# Patient Record
Sex: Female | Born: 1945 | Race: White | Hispanic: No | Smoking: Never smoker
Health system: Southern US, Community
[De-identification: ages and names within clinical notes are randomized; demographics above are authoritative.]

## PROBLEM LIST (undated history)

## (undated) DIAGNOSIS — F0781 Postconcussional syndrome: Secondary | ICD-10-CM

## (undated) DIAGNOSIS — F419 Anxiety disorder, unspecified: Secondary | ICD-10-CM

## (undated) DIAGNOSIS — S161XXA Strain of muscle, fascia and tendon at neck level, initial encounter: Secondary | ICD-10-CM

## (undated) DIAGNOSIS — J45909 Unspecified asthma, uncomplicated: Secondary | ICD-10-CM

## (undated) DIAGNOSIS — R430 Anosmia: Secondary | ICD-10-CM

## (undated) HISTORY — PX: CHOLECYSTECTOMY: SHX55

## (undated) HISTORY — PX: KNEE ARTHROSCOPY: SUR90

---
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
## 1998-03-22 ENCOUNTER — Other Ambulatory Visit: Admission: RE | Admit: 1998-03-22 | Discharge: 1998-03-22 | Payer: Self-pay | Admitting: Obstetrics and Gynecology

## 2000-02-14 ENCOUNTER — Encounter: Admission: RE | Admit: 2000-02-14 | Discharge: 2000-02-14 | Payer: Self-pay | Admitting: Obstetrics and Gynecology

## 2000-02-14 ENCOUNTER — Encounter: Payer: Self-pay | Admitting: Obstetrics and Gynecology

## 2001-05-15 ENCOUNTER — Encounter: Payer: Self-pay | Admitting: Obstetrics and Gynecology

## 2001-05-15 ENCOUNTER — Encounter: Admission: RE | Admit: 2001-05-15 | Discharge: 2001-05-15 | Payer: Self-pay | Admitting: Obstetrics and Gynecology

## 2001-09-05 ENCOUNTER — Ambulatory Visit (HOSPITAL_COMMUNITY): Admission: RE | Admit: 2001-09-05 | Discharge: 2001-09-05 | Payer: Self-pay | Admitting: Obstetrics and Gynecology

## 2001-09-05 ENCOUNTER — Encounter: Payer: Self-pay | Admitting: Obstetrics and Gynecology

## 2002-05-20 ENCOUNTER — Encounter: Payer: Self-pay | Admitting: Obstetrics and Gynecology

## 2002-05-20 ENCOUNTER — Encounter: Admission: RE | Admit: 2002-05-20 | Discharge: 2002-05-20 | Payer: Self-pay | Admitting: Obstetrics and Gynecology

## 2003-02-15 ENCOUNTER — Encounter: Payer: Self-pay | Admitting: Internal Medicine

## 2003-02-15 ENCOUNTER — Ambulatory Visit (HOSPITAL_COMMUNITY): Admission: RE | Admit: 2003-02-15 | Discharge: 2003-02-15 | Payer: Self-pay | Admitting: Internal Medicine

## 2003-04-02 ENCOUNTER — Ambulatory Visit (HOSPITAL_COMMUNITY): Admission: RE | Admit: 2003-04-02 | Discharge: 2003-04-02 | Payer: Self-pay | Admitting: Internal Medicine

## 2003-05-28 ENCOUNTER — Encounter: Admission: RE | Admit: 2003-05-28 | Discharge: 2003-05-28 | Payer: Self-pay | Admitting: Obstetrics and Gynecology

## 2003-05-28 ENCOUNTER — Encounter: Payer: Self-pay | Admitting: Obstetrics and Gynecology

## 2004-05-31 ENCOUNTER — Encounter: Admission: RE | Admit: 2004-05-31 | Discharge: 2004-05-31 | Payer: Self-pay | Admitting: Obstetrics and Gynecology

## 2005-07-26 ENCOUNTER — Ambulatory Visit (HOSPITAL_COMMUNITY): Admission: RE | Admit: 2005-07-26 | Discharge: 2005-07-26 | Payer: Self-pay | Admitting: Obstetrics and Gynecology

## 2006-08-30 ENCOUNTER — Ambulatory Visit (HOSPITAL_COMMUNITY): Admission: RE | Admit: 2006-08-30 | Discharge: 2006-08-30 | Payer: Self-pay | Admitting: Obstetrics and Gynecology

## 2007-09-04 ENCOUNTER — Ambulatory Visit (HOSPITAL_COMMUNITY): Admission: RE | Admit: 2007-09-04 | Discharge: 2007-09-04 | Payer: Self-pay | Admitting: Obstetrics and Gynecology

## 2008-09-07 ENCOUNTER — Encounter: Admission: RE | Admit: 2008-09-07 | Discharge: 2008-09-07 | Payer: Self-pay | Admitting: Obstetrics and Gynecology

## 2009-09-20 ENCOUNTER — Encounter: Admission: RE | Admit: 2009-09-20 | Discharge: 2009-09-20 | Payer: Self-pay | Admitting: Obstetrics and Gynecology

## 2009-09-29 ENCOUNTER — Encounter: Admission: RE | Admit: 2009-09-29 | Discharge: 2009-09-29 | Payer: Self-pay | Admitting: Obstetrics and Gynecology

## 2010-10-05 ENCOUNTER — Encounter
Admission: RE | Admit: 2010-10-05 | Discharge: 2010-10-05 | Payer: Self-pay | Source: Home / Self Care | Attending: Obstetrics and Gynecology | Admitting: Obstetrics and Gynecology

## 2010-10-22 ENCOUNTER — Encounter: Payer: Self-pay | Admitting: Obstetrics and Gynecology

## 2011-02-16 NOTE — Op Note (Signed)
   NAME:  Alison House, Alison House                          ACCOUNT NO.:  [id number]   MEDICAL RECORD NO.:  [id number]                   PATIENT TYPE:  AMB   LOCATION:  DAY                                  FACILITY:  [facility]   PHYSICIAN:  Lionel December, M.D.                 DATE OF BIRTH:  05-18-1946   DATE OF PROCEDURE:  04/02/2003  DATE OF DISCHARGE:                                 OPERATIVE REPORT   PROCEDURE:  Total colonoscopy.   INDICATIONS:  Karinda is a 65 year old Caucasian female who is undergoing  screening colonoscopy.  She is average risk for CRC.  Procedure was reviewed  with the patient and informed consent was obtained.   PREOPERATIVE MEDICATIONS:  Demerol 50 mg IV, Versed 7 mg IV in divided  doses.   FINDINGS:  The procedure was performed in the endoscopy suite.  The  patient's vital signs and O2 saturation were monitored during the procedure  and remained stable.  The patient was placed in the left lateral recumbent  position.  Rectal examination was performed.  No abnormality noted on  external or digital exam.  Olympus videoscope was placed in the rectum and  advanced under vision into the sigmoid colon and beyond.  Preparation was  satisfactory.  She had some liquid stool scattered here and there.  The  scope was passed to the cecum which was identified by the ileocecal valve  and appendiceal orifice.  Pictures taken for right record and part of a data  base.  As the scope was withdrawn, colonic mucosa was carefully examined.  There was a 5 mm  polyp at the proximal transverse colon which was snared  for histologic examination.  Mucosa and the rest of the colon was normal.  Rectal mucosa similarly was normal.  Scope was retroflexed to examine the  anorectal junction and small hemorrhoids were noted below the dentate line.  There was also a small papilla.  Endoscope was then withdrawn.  The patient  tolerated the procedure well.   FINAL DIAGNOSIS:  1. A 5 mm polyp snared  from the transverse colon.  2. Small external hemorrhoids.   RECOMMENDATIONS:  Standard instructions given.  I will be contacting the  patient with biopsy results.  If this is an adenoma, she will return for  repeat examination in five years; otherwise could wait 10 years before next  exam.                                               Lionel December, M.D.   NR/MEDQ  D:  04/02/2003  T:  04/02/2003  Job:  [id number]   cc:   Kingsley Callander. Ouida Sills, M.D.  [address]  [place]  [place] [address]  Fax: [phone]

## 2011-10-15 ENCOUNTER — Other Ambulatory Visit (HOSPITAL_COMMUNITY): Payer: Self-pay | Admitting: Internal Medicine

## 2011-10-15 DIAGNOSIS — Z139 Encounter for screening, unspecified: Secondary | ICD-10-CM

## 2011-10-19 ENCOUNTER — Ambulatory Visit (HOSPITAL_COMMUNITY): Payer: Self-pay

## 2011-10-26 ENCOUNTER — Ambulatory Visit (HOSPITAL_COMMUNITY): Payer: Self-pay

## 2011-11-01 ENCOUNTER — Ambulatory Visit (HOSPITAL_COMMUNITY)
Admission: RE | Admit: 2011-11-01 | Discharge: 2011-11-01 | Disposition: A | Discharge status: Home / Self Care | Payer: Medicare Other | Source: Ambulatory Visit | Attending: Internal Medicine | Admitting: Internal Medicine

## 2011-11-01 DIAGNOSIS — Z1231 Encounter for screening mammogram for malignant neoplasm of breast: Secondary | ICD-10-CM | POA: Insufficient documentation

## 2011-11-01 DIAGNOSIS — Z139 Encounter for screening, unspecified: Secondary | ICD-10-CM

## 2012-11-17 ENCOUNTER — Other Ambulatory Visit: Payer: Self-pay | Admitting: Internal Medicine

## 2012-11-17 DIAGNOSIS — Z1231 Encounter for screening mammogram for malignant neoplasm of breast: Secondary | ICD-10-CM

## 2012-12-15 ENCOUNTER — Ambulatory Visit: Payer: Medicare Other

## 2013-01-08 ENCOUNTER — Ambulatory Visit
Admission: RE | Admit: 2013-01-08 | Discharge: 2013-01-08 | Disposition: A | Discharge status: Home / Self Care | Payer: Medicare Other | Source: Ambulatory Visit | Attending: Internal Medicine | Admitting: Internal Medicine

## 2013-01-08 DIAGNOSIS — Z1231 Encounter for screening mammogram for malignant neoplasm of breast: Secondary | ICD-10-CM

## 2013-03-25 ENCOUNTER — Other Ambulatory Visit (HOSPITAL_COMMUNITY): Payer: Self-pay | Admitting: Internal Medicine

## 2013-03-25 ENCOUNTER — Ambulatory Visit (HOSPITAL_COMMUNITY)
Admission: RE | Admit: 2013-03-25 | Discharge: 2013-03-25 | Disposition: A | Discharge status: Home / Self Care | Payer: Medicare Other | Source: Ambulatory Visit | Attending: Internal Medicine | Admitting: Internal Medicine

## 2013-03-25 DIAGNOSIS — W19XXXA Unspecified fall, initial encounter: Secondary | ICD-10-CM | POA: Insufficient documentation

## 2013-03-25 DIAGNOSIS — S99919A Unspecified injury of unspecified ankle, initial encounter: Secondary | ICD-10-CM | POA: Insufficient documentation

## 2013-03-25 DIAGNOSIS — M25569 Pain in unspecified knee: Secondary | ICD-10-CM | POA: Insufficient documentation

## 2013-03-25 DIAGNOSIS — M25562 Pain in left knee: Secondary | ICD-10-CM

## 2013-03-25 DIAGNOSIS — S8990XA Unspecified injury of unspecified lower leg, initial encounter: Secondary | ICD-10-CM | POA: Insufficient documentation

## 2013-03-25 DIAGNOSIS — M25469 Effusion, unspecified knee: Secondary | ICD-10-CM | POA: Insufficient documentation

## 2013-04-23 ENCOUNTER — Other Ambulatory Visit (HOSPITAL_COMMUNITY): Payer: Self-pay | Admitting: Internal Medicine

## 2013-04-23 DIAGNOSIS — M25562 Pain in left knee: Secondary | ICD-10-CM

## 2013-04-27 ENCOUNTER — Encounter (HOSPITAL_COMMUNITY): Payer: Self-pay

## 2013-04-27 ENCOUNTER — Ambulatory Visit (HOSPITAL_COMMUNITY)
Admission: RE | Admit: 2013-04-27 | Discharge: 2013-04-27 | Disposition: A | Discharge status: Home / Self Care | Payer: Medicare Other | Source: Ambulatory Visit | Attending: Internal Medicine | Admitting: Internal Medicine

## 2013-04-27 DIAGNOSIS — M7120 Synovial cyst of popliteal space [Baker], unspecified knee: Secondary | ICD-10-CM | POA: Insufficient documentation

## 2013-04-27 DIAGNOSIS — IMO0002 Reserved for concepts with insufficient information to code with codable children: Secondary | ICD-10-CM | POA: Insufficient documentation

## 2013-04-27 DIAGNOSIS — Z9181 History of falling: Secondary | ICD-10-CM | POA: Insufficient documentation

## 2013-04-27 DIAGNOSIS — M25562 Pain in left knee: Secondary | ICD-10-CM

## 2013-04-27 DIAGNOSIS — S83289A Other tear of lateral meniscus, current injury, unspecified knee, initial encounter: Secondary | ICD-10-CM | POA: Insufficient documentation

## 2013-04-27 DIAGNOSIS — W19XXXA Unspecified fall, initial encounter: Secondary | ICD-10-CM | POA: Insufficient documentation

## 2013-04-27 DIAGNOSIS — M25569 Pain in unspecified knee: Secondary | ICD-10-CM | POA: Insufficient documentation

## 2013-07-28 ENCOUNTER — Ambulatory Visit (HOSPITAL_COMMUNITY)
Admission: RE | Admit: 2013-07-28 | Discharge: 2013-07-28 | Disposition: A | Discharge status: Home / Self Care | Payer: Medicare Other | Source: Ambulatory Visit | Attending: Orthopedic Surgery | Admitting: Orthopedic Surgery

## 2013-07-28 DIAGNOSIS — M25669 Stiffness of unspecified knee, not elsewhere classified: Secondary | ICD-10-CM | POA: Insufficient documentation

## 2013-07-28 DIAGNOSIS — M25569 Pain in unspecified knee: Secondary | ICD-10-CM | POA: Insufficient documentation

## 2013-07-28 DIAGNOSIS — M25462 Effusion, left knee: Secondary | ICD-10-CM | POA: Insufficient documentation

## 2013-07-28 DIAGNOSIS — M25562 Pain in left knee: Secondary | ICD-10-CM

## 2013-07-28 DIAGNOSIS — M25469 Effusion, unspecified knee: Secondary | ICD-10-CM | POA: Insufficient documentation

## 2013-07-28 DIAGNOSIS — IMO0001 Reserved for inherently not codable concepts without codable children: Secondary | ICD-10-CM | POA: Diagnosis present

## 2013-07-28 NOTE — Evaluation (Signed)
Physical Therapy Evaluation  Patient Details  Name: JESSEL GETTINGER MRN: [id number] Date of Birth: 1946/09/04  Today's Date: 07/28/2013 Time:1300 [id number] PT Time Calculation (min): 45 mincharge evaluation              Visit#: 11 of 8 8 Re-eval: 08/27/13 Assessment Diagnosis: Lt knee arthroscopy Surgical Date: 07/10/13 Next MD Visit: 08/2013 Prior Therapy: none  Authorization: BCBS Medicare BCBS Medicare    Authorization Visit#: 11 of 8 8  Past Medical History: No past medical history on file. Past Surgical History: No past surgical history on file.  Subjective Symptoms/Limitations Symptoms: Pt states that she had her surgery on 07/11/2103.  She is doing much better since the surgery.  She is icing three times a day.  She states that she still feels very tight in the knee.  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  She has noticed a little weakness in her Lt leg but no pain.  She is being referred to therapy to return her to full functional ability. How long can you sit comfortably?: Pt able to sit for ten minutes and then she wants to move her knee How long can you stand comfortably?: Able to stand for 30 minutes but no pain How long can you walk comfortably?: Able to walk for 15 minutes but this is not limited by pain task is just simply done. Pain Assessment Currently in Pain?: Yes Pain Score: 0-No pain Pain Location: Knee Pain Orientation: Left     Prior Functioning  Prior Function Level of Independence: Independent with basic ADLs Vocation: Full time employment Vocation Requirements: sitting, standing Leisure: Hobbies-no  Cognition/Observation Cognition Overall Cognitive Status: Within Functional Limits for tasks assessed  Sensation/Coordination/Flexibility/Functional Tests  foto 47  Assessment LLE AROM (degrees) Left Knee Extension: 15 Left Knee Flexion: 102 LLE Strength Left Hip Flexion: 5/5 Left Hip  Extension: 5/5 Left Hip ABduction: 5/5 Left Knee Flexion: 5/5 Left Knee Extension: 5/5 Left Ankle Dorsiflexion: 5/5  Exercise/Treatments Mobility/Balance  Static Standing Balance Single Leg Stance - Right Leg: 30 Single Leg Stance - Left Leg: 25     Supine Quad Sets: 10 reps Heel Slides: 10 reps Terminal Knee Extension: 10 reps   Manual Therapy Manual Therapy: Other (comment) Other Manual Therapy: decongestve techniques to decrease swelling  Physical Therapy Assessment and Plan PT Assessment and Plan Clinical Impression Statement: Pt s/p Lt knee arthroscopic surgery for partial menisectomy who has decreased ROM, edema, and decreased balance.  Ms. Boster will benefti from skilled PT to improve her deficits and maximize her functional ability. Pt will benefit from skilled therapeutic intervention in order to improve on the following deficits: Difficulty walking;Abnormal gait;Increased edema;Decreased range of motion;Decreased balance;Decreased strength Rehab Potential: Good PT Frequency: Min 2X/week PT Duration: 4 weeks PT Treatment/Interventions: Therapeutic activities;Therapeutic exercise;Manual techniques PT Plan: begin bike, rockerboard, standing terminal ext, standing knee flexion, slant board.  Progress to step ups and functional squatting.    Goals Home Exercise Program Pt/caregiver will Perform Home Exercise Program: For increased strengthening;For increased ROM PT Short Term Goals Time to Complete Short Term Goals: 2 weeks PT Short Term Goal 1: Pt ROM to be 5-110 to allow more normalized gait pattern PT Short Term Goal 2: Pt to be able to sit for an hour to enjoy a meal at a restaurant PT Long Term Goals Time to Complete Long Term Goals: 4 weeks PT Long Term Goal 1: Pt  to be completing an advanced HEP PT Long Term Goal 2: Pt ROM to be 0 -125 to allow squatting activites Long Term Goal 3: Pt to RTW with no difficulty  Long Term Goal 4: Strength wnl to be able to go  up and down steps in a reciprocal manner  Problem List Patient Active Problem List   Diagnosis Date Noted  . Stiffness of joint, not elsewhere classified, lower leg 07/28/2013  . Pain in knee 07/28/2013  . Swelling of joint of left knee 07/28/2013       GP Functional Limitation: Changing and maintaining body position Changing and Maintaining Body Position Current Status (N8295): At least 40 percent but less than 60 percent impaired, limited or restricted Changing and Maintaining Body Position Goal Status (A2130): At least 20 percent but less than 40 percent impaired, limited or restricted  Madalene Mickler,CINDY 07/28/2013, 4:51 PM  Physician Documentation Your signature is required to indicate approval of the treatment plan as stated above.  Please sign and either send electronically or make a copy of this report for your files and return this physician signed original.   Please mark one 1.__approve of plan  2. ___approve of plan with the following conditions.   ______________________________                                                          _____________________ Physician Signature                                                                                                             Date

## 2013-07-30 ENCOUNTER — Ambulatory Visit (HOSPITAL_COMMUNITY)
Admission: RE | Admit: 2013-07-30 | Discharge: 2013-07-30 | Disposition: A | Discharge status: Home / Self Care | Payer: Medicare Other | Source: Ambulatory Visit | Attending: Internal Medicine | Admitting: Internal Medicine

## 2013-07-30 DIAGNOSIS — IMO0001 Reserved for inherently not codable concepts without codable children: Secondary | ICD-10-CM | POA: Diagnosis not present

## 2013-07-30 NOTE — Progress Notes (Signed)
Physical Therapy Treatment Patient Details  Name: Alison House MRN: [id number] Date of Birth: 03/21/1946  Today's Date: 07/30/2013 Time: 1302-1350 PT Time Calculation (min): 48 min  Visit#: 2 of 8  Re-eval: 08/27/13 Authorization: BCBS Medicare  Authorization Visit#: 2 of 8  Charges:  therex [id number] (33'), massage 1337-1347 (10)  Subjective: Symptoms/Limitations Symptoms: Pt reports compliance with HEP.  Currently without pain only stiffness.   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  Pt able to demonstrate established therex correctly.  Overall progressing well with stiffness/swelling more than pain.  Pt instruced with scar massage due to noted adhesions at surgical sites.  Improved ROM with decrease swelling following retro massage. PT Plan: continue to progress strength and function.   Progress to step ups and functional squatting.     Problem List Patient Active Problem List   Diagnosis Date Noted  . Stiffness of joint, not elsewhere classified, lower leg 07/28/2013  . Pain in knee 07/28/2013  . Swelling of joint of left knee 07/28/2013       GP Functional Limitation: Changing and maintaining body position  Lurena Nida, PTA/CLT 07/30/2013, 3:09 PM

## 2013-08-04 ENCOUNTER — Ambulatory Visit (HOSPITAL_COMMUNITY)
Admission: RE | Admit: 2013-08-04 | Discharge: 2013-08-04 | Disposition: A | Discharge status: Home / Self Care | Payer: Medicare Other | Source: Ambulatory Visit | Attending: Orthopedic Surgery | Admitting: Orthopedic Surgery

## 2013-08-04 DIAGNOSIS — IMO0001 Reserved for inherently not codable concepts without codable children: Secondary | ICD-10-CM | POA: Insufficient documentation

## 2013-08-04 DIAGNOSIS — M25469 Effusion, unspecified knee: Secondary | ICD-10-CM | POA: Insufficient documentation

## 2013-08-04 DIAGNOSIS — M25669 Stiffness of unspecified knee, not elsewhere classified: Secondary | ICD-10-CM | POA: Insufficient documentation

## 2013-08-04 DIAGNOSIS — M25569 Pain in unspecified knee: Secondary | ICD-10-CM | POA: Insufficient documentation

## 2013-08-04 NOTE — Progress Notes (Signed)
Physical Therapy Treatment Patient Details  Name: Alison House MRN: [id number] Date of Birth: 02/12/1946  Today's Date: 08/04/2013 Time: 4098-1191 PT Time Calculation (min): 46 min Visit#: 3 of 8  Re-eval: 08/27/13 Authorization: BCBS Medicare  Authorization Visit#: 3 of 8  Charges:  therex (34') 1432-1506, manual (10') [id number]  Subjective: Symptoms/Limitations Symptoms: Pt states she returned to work today.  States she tried to keep it moving while she was driving so it wouldnt be so stiff when she got out of her car. Pain Assessment Currently in Pain?: No/denies   Exercise/Treatments Stretches Gastroc Stretch: 3 reps;30 seconds;Limitations Gastroc Stretch Limitations: slant board Aerobic Stationary Bike: 8' seat 8 Standing Heel Raises: 10 reps;Limitations Heel Raises Limitations: toeraises 10 reps Knee Flexion: 10 reps Lateral Step Up: 10 reps;Step Height: 4";Left;Hand Hold: 1 Functional Squat: 10 reps Rocker Board: 2 minutes SLS: 3 trials 34" max Supine Quad Sets: 15 reps Heel Slides: 10 reps   Manual Therapy Manual Therapy: Massage Massage: Retro massage to Lt knee with elevation to decrease swelling; massage to scars from surgery   Physical Therapy Assessment and Plan PT Assessment and Plan Clinical Impression Statement: Added lateral step ups and squats today.  pt with difficulty performing squats in correct form.  Improving strength with overall reduction in swelling and adhesions around scar in Rt knee.  Pt reported pain reduction at end of session following manual techniques. PT Plan: continue to progress strength and function.   Add forward step ups/downs and lunges next visit.     Problem List Patient Active Problem List   Diagnosis Date Noted  . Stiffness of joint, not elsewhere classified, lower leg 07/28/2013  . Pain in knee 07/28/2013  . Swelling of joint of left knee 07/28/2013       Lurena Nida, PTA/CLT 08/04/2013, 3:56 PM

## 2013-08-06 ENCOUNTER — Ambulatory Visit (HOSPITAL_COMMUNITY)
Admission: RE | Admit: 2013-08-06 | Discharge: 2013-08-06 | Disposition: A | Discharge status: Home / Self Care | Payer: Medicare Other | Source: Ambulatory Visit | Attending: Internal Medicine | Admitting: Internal Medicine

## 2013-08-06 DIAGNOSIS — M25462 Effusion, left knee: Secondary | ICD-10-CM

## 2013-08-06 DIAGNOSIS — M25562 Pain in left knee: Secondary | ICD-10-CM

## 2013-08-06 DIAGNOSIS — M25669 Stiffness of unspecified knee, not elsewhere classified: Secondary | ICD-10-CM

## 2013-08-06 NOTE — Progress Notes (Signed)
Physical Therapy Treatment Patient Details  Name: Alison House MRN: [id number] Date of Birth: [birth year]  Today's Date: 08/06/2013 Time: 7829-5621 PT Time Calculation (min): 53 min Charge:  There ex 1425-1506; IP [id number] Visit#: 4 of 8  Re-eval: 08/27/13    Authorization: BCBS Medicare    Subjective: Symptoms/Limitations Symptoms: Pt states that since she has returned to work she has had more swelling and pain.  Only able to ice twice a day due to work. Pain Assessment Currently in Pain?: Yes Pain Score: 6  Pain Orientation: Left   Exercise/Treatments       Stretches Gastroc Stretch: 3 reps;30 seconds;Limitations Gastroc Stretch Limitations: slant board Aerobic Stationary Bike: 8' seat 8   Standing Heel Raises: 10 reps;Limitations Knee Flexion: 10 reps Terminal Knee Extension: 10 reps Lateral Step Up: 10 reps;Step Height: 4";Left;Hand Hold: 1 Forward Step Up: 10 reps Functional Squat: 10 reps Rocker Board: 2 minutes SLS with Vectors: 3 x10 sec    Supine Quad Sets: 15 reps Heel Slides: 10 reps Terminal Knee Extension: 10 reps  Modalities Modalities: Cryotherapy Cryotherapy Number Minutes Cryotherapy: 10 Minutes Cryotherapy Location: Knee Type of Cryotherapy: Ice pack  Physical Therapy Assessment and Plan PT Assessment and Plan Clinical Impression Statement: Pt completed vector stances, terminal extension and slant board stretch.  Pt improving in balance and ROM.   PT Plan: Add lunges assess if ice or massage helps with pain/swelling the most     Problem List Patient Active Problem List   Diagnosis Date Noted  . Stiffness of joint, not elsewhere classified, lower leg 07/28/2013  . Pain in knee 07/28/2013  . Swelling of joint of left knee 07/28/2013       GP    Emanual Lamountain,CINDY 08/06/2013, 4:51 PM

## 2013-08-11 ENCOUNTER — Ambulatory Visit (HOSPITAL_COMMUNITY)
Admission: RE | Admit: 2013-08-11 | Discharge: 2013-08-11 | Disposition: A | Discharge status: Home / Self Care | Payer: Medicare Other | Source: Ambulatory Visit | Attending: Internal Medicine | Admitting: Internal Medicine

## 2013-08-11 DIAGNOSIS — M25562 Pain in left knee: Secondary | ICD-10-CM

## 2013-08-11 DIAGNOSIS — M25462 Effusion, left knee: Secondary | ICD-10-CM

## 2013-08-11 DIAGNOSIS — M25669 Stiffness of unspecified knee, not elsewhere classified: Secondary | ICD-10-CM

## 2013-08-11 NOTE — Evaluation (Signed)
Physical Therapy Evaluation  Patient Details  Name: Alison House MRN: [id number] Date of Birth: [birth year]  Today's Date: 08/11/2013 Time: 0936-1023 PT Time Calculation (min): 47 min Charge there es [phone]; gt [phone]; manual 1005-1023             Visit#: 5 of 8  Re-eval: 08/27/13 Assessment Diagnosis: Lt knee arthroscopy Surgical Date: 07/10/13 Next MD Visit: 08/2013 Prior Therapy: none  Authorization: BCBS Medicare    Authorization Time Period:    Authorization Visit#: 4 of 8   Past Medical History: No past medical history on file. Past Surgical History: No past surgical history on file.  Subjective Symptoms/Limitations Symptoms: Pt states that she continues to have swelling but she is in and out of her car all day with her job.  Pt feels the retro massage does better to control her swelling compared to the ice How long can you sit comfortably?: Able to sit for 15 minutes with comfort was 10. How long can you stand comfortably?: Able to stand for 30 minutes and then she is done with her task. How long can you walk comfortably?: Longest she has walked for at one time is an hour was 15 minutes. Pain Assessment Currently in Pain?: No/denies (more discomfort than pain was 6/10)  Prior Functioning  Prior Function Level of Independence: Independent with basic ADLs Vocation: Full time employment Vocation Requirements: sitting, standing Leisure: Hobbies-no  Cognition/Observation Cognition Overall Cognitive Status: Within Functional Limits for tasks assessed  Assessment LLE AROM (degrees) Left Knee Extension: 13 Left Knee Flexion: 110 (102) LLE PROM (degrees) Left Knee Extension: 115 Left Knee Flexion: 8 LLE Strength Left Hip Flexion: 5/5 Left Hip Extension: 5/5 Left Hip ABduction: 5/5 Left Knee Flexion: 5/5 Left Knee Extension: 5/5 Left Ankle Dorsiflexion: 5/5  Exercise/Treatments Mobility/Balance  Static Standing Balance Single Leg Stance - Right Leg:  30 Single Leg Stance - Left Leg: 25   Stretches Active Hamstring Stretch: 3 reps;30 seconds Passive Hamstring Stretch: 2 reps;60 seconds   Standing Terminal Knee Extension: 15 reps Stairs: 1 Rt-stairwelll Gait Training: proper heel toe gt Supine Quad Sets: 15 reps Heel Slides: 15 reps Terminal Knee Extension: 15 reps Knee Extension: PROM Knee Flexion: PROM    Manual Therapy Manual Therapy: Massage Massage: retro massage  Physical Therapy Assessment and Plan PT Assessment and Plan Clinical Impression Statement: Pt is not completing quad sets at home emphasised the importance of this.  Pt continued to have an antalgic gait after working on proper heel toe gait pt gait patter appeared normal  PT Plan: Pt has three more visits concentrate on gait, ROM and swelling with manual techniques    Goals Home Exercise Program Pt/caregiver will Perform Home Exercise Program: For increased strengthening;For increased ROM PT Goal: Perform Home Exercise Program - Progress: Progressing toward goal PT Short Term Goals PT Short Term Goal 1: Pt ROM to be 5-110 to allow more normalized gait pattern PT Short Term Goal 1 - Progress: Progressing toward goal PT Short Term Goal 2: Pt to be able to sit for an hour to enjoy a meal at a restaurant PT Short Term Goal 2 - Progress: Progressing toward goal PT Long Term Goals Time to Complete Long Term Goals: 4 weeks PT Long Term Goal 1: Pt to be completing an advanced HEP PT Long Term Goal 2: Pt ROM to be 0 -125 to allow squatting activites PT Long Term Goal 2 - Progress: Not met Long Term Goal 3: Pt to RTW with no  difficulty  Long Term Goal 3 Progress: Progressing toward goal Long Term Goal 4: Strength wnl to be able to go up and down steps in a reciprocal manner Long Term Goal 4 Progress: Met  Problem List Patient Active Problem List   Diagnosis Date Noted  . Stiffness of joint, not elsewhere classified, lower leg 07/28/2013  . Pain in knee  07/28/2013  . Swelling of joint of left knee 07/28/2013       GP    [name],CINDY 08/11/2013, 10:42 AM  Physician Documentation Your signature is required to indicate approval of the treatment plan as stated above.  Please sign and either send electronically or make a copy of this report for your files and return this physician signed original.   Please mark one 1.__approve of plan  2. ___approve of plan with the following conditions.   ______________________________                                                          _____________________ Physician Signature                                                                                                             Date

## 2013-08-13 ENCOUNTER — Ambulatory Visit (HOSPITAL_COMMUNITY)
Admission: RE | Admit: 2013-08-13 | Discharge: 2013-08-13 | Disposition: A | Discharge status: Home / Self Care | Payer: Medicare Other | Source: Ambulatory Visit | Attending: Internal Medicine | Admitting: Internal Medicine

## 2013-08-13 NOTE — Progress Notes (Signed)
Physical Therapy Treatment Patient Details  Name: Alison House MRN: [id number] Date of Birth: 23-Aug-1946  Today's Date: 08/13/2013 Time: 6578-4696 PT Time Calculation (min): 43 min Charges: Therex x 28'(1432-1500) Manual x 12'(1503-1515)  Visit#: 6 of 8  Re-eval: 08/27/13  Authorization: BCBS Medicare  Authorization Visit#: 6 of 8   Subjective: Symptoms/Limitations Symptoms: Pt states that she is not having any pain currently. Pain Assessment Currently in Pain?: No/denies  Exercise/Treatments Stretches Passive Hamstring Stretch: 3 reps;30 seconds;Limitations Passive Hamstring Stretch Limitations: with rope Supine Quad Sets: 10 reps;Limitations Quad Sets Limitations: 5" holds Short Arc Quad Sets: 10 reps;Limitations Short Arc Quad Sets Limitations: 5" holds Terminal Knee Extension: 10 reps;Limitations Terminal Knee Extension Limitations: 5" holds Knee Extension: PROM Knee Flexion: PROM   Modalities Modalities: Cryotherapy Manual Therapy Manual Therapy: Edema management Edema Management: Retrograde massage to decrease swelling in LLE.  Physical Therapy Assessment and Plan PT Assessment and Plan Clinical Impression Statement: Treatment focus on increasing motion/distal quad strength and decreasing swelling. Pt requires multimodal cueing to improve distal quad contraction. Retrograde massage completed to decrease swelling. Pt displays improved gait mechanics this session. Pt plans to ice knee at home. PT Plan: Concentrate on gait, ROM and swelling with manual techniques    Problem List Patient Active Problem List   Diagnosis Date Noted  . Stiffness of joint, not elsewhere classified, lower leg 07/28/2013  . Pain in knee 07/28/2013  . Swelling of joint of left knee 07/28/2013    PT - End of Session Activity Tolerance: Patient tolerated treatment well General Behavior During Therapy: [facility] for tasks assessed/performed  Seth Bake, PTA  08/13/2013, 3:21  PM

## 2013-08-17 ENCOUNTER — Ambulatory Visit (HOSPITAL_COMMUNITY)
Admission: RE | Admit: 2013-08-17 | Discharge: 2013-08-17 | Disposition: A | Discharge status: Home / Self Care | Payer: Medicare Other | Source: Ambulatory Visit | Attending: *Deleted | Admitting: *Deleted

## 2013-08-17 NOTE — Progress Notes (Signed)
Physical Therapy Treatment Patient Details  Name: Alison House MRN: [id number] Date of Birth: 02-Feb-1946  Today's Date: 08/17/2013 Time: 5621-3086 PT Time Calculation (min): 45 min Charges: Therex x 32'(1640-1712) Ice x 10'(1715-1725)  Visit#: 7 of 8  Re-eval: 08/27/13  Authorization: BCBS Medicare  Authorization Visit#: 7 of 8   Subjective: Symptoms/Limitations Symptoms: Pt states that she is currently not having any pain, just stiffness. Pain Assessment Currently in Pain?: No/denies  Exercise/Treatments Stretches Active Hamstring Stretch: 3 reps;30 seconds;Limitations Active Hamstring Stretch Limitations: Seated at edge of mat longsitting Quad Stretch: 2 reps;30 seconds;Limitations Quad Stretch Limitations: Chair lunge Gastroc Stretch: 3 reps;30 seconds;Limitations Gastroc Stretch Limitations: slant board Supine Quad Sets: 10 reps;Limitations Quad Sets Limitations: 5" holds Short Arc [employer] Sets: 10 reps;Limitations Short Arc Quad Sets Limitations: 5" holds Terminal Knee Extension: 10 reps;Limitations Terminal Knee Extension Limitations: 5" holds Knee Extension: PROM Knee Flexion: PROM Other Supine Knee Exercises: Knee to chest stretch to improve knee flexion   Modalities Modalities: Cryotherapy Cryotherapy Number Minutes Cryotherapy: 10 Minutes Cryotherapy Location: Knee Type of Cryotherapy: Ice pack  Physical Therapy Assessment and Plan PT Assessment and Plan Clinical Impression Statement: Pt continues to require multimodal cueing to facilitate distal quad contraction. Significant decrease in swelling noted in LLE. Retrograde massage unnecessary this session. Ice applied t end of session to limit pain and inflammation. PT Plan: Concentrate on gait, ROM and swelling with manual techniques    Goals    Problem List Patient Active Problem List   Diagnosis Date Noted  . Stiffness of joint, not elsewhere classified, lower leg 07/28/2013  . Pain in knee  07/28/2013  . Swelling of joint of left knee 07/28/2013    PT - End of Session Activity Tolerance: Patient tolerated treatment well General Behavior During Therapy: [facility] for tasks assessed/performed PT Plan of Care PT Home Exercise Plan: Given. See scanned documents.  Seth Bake, PTA  08/17/2013, 5:33 PM

## 2013-08-18 ENCOUNTER — Ambulatory Visit (HOSPITAL_COMMUNITY): Payer: Medicare Other | Admitting: Physical Therapy

## 2013-08-20 ENCOUNTER — Ambulatory Visit (HOSPITAL_COMMUNITY)
Admission: RE | Admit: 2013-08-20 | Discharge: 2013-08-20 | Disposition: A | Discharge status: Home / Self Care | Payer: Medicare Other | Source: Ambulatory Visit | Attending: Internal Medicine | Admitting: Internal Medicine

## 2013-08-20 DIAGNOSIS — M25669 Stiffness of unspecified knee, not elsewhere classified: Secondary | ICD-10-CM

## 2013-08-20 DIAGNOSIS — M25462 Effusion, left knee: Secondary | ICD-10-CM

## 2013-08-20 DIAGNOSIS — M25562 Pain in left knee: Secondary | ICD-10-CM

## 2013-08-20 NOTE — Evaluation (Signed)
Physical Therapy Re-Evaluation  Patient Details  Name: Alison House MRN: [id number] Date of Birth: 05-29-1946  Today's Date: 08/20/2013 Time: 1430-1511 PT Time Calculation (min): 41 min Charges:  TE: 1430-1440 ROM: 1440-1441 Manual: 1441-1500 Self Care: 1500-1511             Visit#: 8 of 12  Re-eval: 09/03/13 Assessment Diagnosis: Lt knee arthroscopy Surgical Date: 07/10/13 Next MD Visit: Dr. Thurston Hole - 09/09/13 Prior Therapy: none  Authorization: BCBS Medicare    Authorization Time Period:    Authorization Visit#: 8 of 18   Subjective Symptoms/Limitations Symptoms: Pt reports she has returned to work about 2 weeks.  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  She feels when the swellling goes away she will be doing much better.  Pain Assessment Currently in Pain?: No/denies  Sensation/Coordination/Flexibility/Functional Tests Functional Tests Functional Tests: FOTO: 60/40  Assessment LLE AROM (degrees) Left Knee Extension: 6 (was 13) Left Knee Flexion: 110 (was 110) LLE PROM (degrees) Left Knee Extension: 3 (was 8) Left Knee Flexion: 115 (was 115)  Exercise/Treatments Bike: Seat 8 x10 minutes for AROM  Manual Therapy Manual Therapy: Myofascial release Myofascial Release: to distal quadricep, patella mobs and quadriceps muscle to decrease fascial restrictions.  Used the stick for education and had pt use it on herself for pt education.   Physical Therapy Assessment and Plan PT Assessment and Plan Clinical Impression Statement: Alison House has attended 8 OP PT visits s/p Lt knee scope with following findings: strength is WNL, AROM is improving 6-113 degrees, continues to be limited by increased swelling and "stiffness" to her knee with significant muscle spasms and fascial restrictions.  Educated pt today on self massagers and self massaging techniques and compression stockings to help control his stiffness and swelling. At  this time feel pt will continue to benefit x4 visits to address remaining AROM difficulty and "stiffness" with return to work (RTW) activites.  PT Plan: Manual techniques to decrease fascial restrictions and improve AROM.     Goals Home Exercise Program Pt/caregiver will Perform Home Exercise Program: For increased strengthening;For increased ROM PT Goal: Perform Home Exercise Program - Progress: Met PT Short Term Goals PT Short Term Goal 1: Pt ROM to be 5-110 to allow more normalized gait pattern PT Short Term Goal 2: Pt to be able to sit for an hour to enjoy a meal at a restaurant (no difficulty sitting) PT Short Term Goal 2 - Progress: Met PT Long Term Goals Time to Complete Long Term Goals: 4 weeks PT Long Term Goal 1: Pt to be completing an advanced HEP PT Long Term Goal 1 - Progress: Met PT Long Term Goal 2: Pt ROM to be 0 -125 to allow squatting activites Long Term Goal 3: Pt to RTW with no difficulty  Long Term Goal 3 Progress: Partly met (stiffness getting into and out of her car) Long Term Goal 4: Strength wnl to be able to go up and down steps in a reciprocal manner Long Term Goal 4 Progress: Met  Problem List Patient Active Problem List   Diagnosis Date Noted  . Stiffness of joint, not elsewhere classified, lower leg 07/28/2013  . Pain in knee 07/28/2013  . Swelling of joint of left knee 07/28/2013    PT - End of Session Activity Tolerance: Patient tolerated treatment well General Behavior During Therapy: [facility] for tasks assessed/performed PT Plan of Care PT Home Exercise Plan: Given. See scanned documents.  PT Patient Instructions: Educated on self massagers "the stick" and compression garments.  Consulted and Agree with Plan of Care: Patient  GP Functional Assessment Tool Used: FOTO: 60/40 Functional Limitation: Changing and maintaining body position Changing and Maintaining Body Position Current Status (Z6109): At least 40 percent but less than 60 percent  impaired, limited or restricted Changing and Maintaining Body Position Goal Status (U0454): At least 20 percent but less than 40 percent impaired, limited or restricted  Sandip Power, MPT, ATC 08/20/2013, 3:20 PM  Physician Documentation Your signature is required to indicate approval of the treatment plan as stated above.  Please sign and either send electronically or make a copy of this report for your files and return this physician signed original.   Please mark one 1.__approve of plan  2. ___approve of plan with the following conditions.   ______________________________                                                          _____________________ Physician Signature                                                                                                             Date

## 2013-09-01 ENCOUNTER — Ambulatory Visit (HOSPITAL_COMMUNITY)
Admission: RE | Admit: 2013-09-01 | Discharge: 2013-09-01 | Disposition: A | Discharge status: Home / Self Care | Payer: Medicare Other | Source: Ambulatory Visit | Attending: Orthopedic Surgery | Admitting: Orthopedic Surgery

## 2013-09-01 DIAGNOSIS — M25569 Pain in unspecified knee: Secondary | ICD-10-CM | POA: Insufficient documentation

## 2013-09-01 DIAGNOSIS — IMO0001 Reserved for inherently not codable concepts without codable children: Secondary | ICD-10-CM | POA: Insufficient documentation

## 2013-09-01 DIAGNOSIS — M25462 Effusion, left knee: Secondary | ICD-10-CM

## 2013-09-01 DIAGNOSIS — M25469 Effusion, unspecified knee: Secondary | ICD-10-CM | POA: Insufficient documentation

## 2013-09-01 DIAGNOSIS — M25562 Pain in left knee: Secondary | ICD-10-CM

## 2013-09-01 DIAGNOSIS — M25669 Stiffness of unspecified knee, not elsewhere classified: Secondary | ICD-10-CM | POA: Insufficient documentation

## 2013-09-01 NOTE — Progress Notes (Signed)
Physical Therapy Treatment Patient Details  Name: Alison House MRN: [id number] Date of Birth: [birth year]  Today's Date: 09/01/2013 Time: 4696-2952 PT Time Calculation (min): 46 min Charges: Manual: 840-855 TE: 853-916 Ice: 1 Visit#: 9 of 12  Re-eval: 09/03/13    Authorization: BCBS Medicare  Authorization Time Period:    Authorization Visit#: 9 of 18   Subjective: Symptoms/Limitations Symptoms: She is using her stick when she is driving long distances.  Pt states most difficulty with stiffness when getting out of the car. She is wearing compression stockings.  Pain Assessment Currently in Pain?: No/denies  Precautions/Restrictions     Exercise/Treatments Stretches Hip Flexor Stretch: 3 reps;30 seconds Gastroc Stretch: 3 reps;30 seconds Gastroc Stretch Limitations: slant board   Standing Terminal Knee Extension: Left;15 reps;10 reps (w/PT facilaiton) Other Standing Knee Exercises: Heel walking 2 RT Other Standing Knee Exercises: Postural education on TKE x10 reps 5 sec holds Supine Quad Sets: 10 reps;Limitations Quad Sets Limitations: 5" holds Short Arc [employer] Sets: 10 reps;Limitations Short Arc Quad Sets Limitations: 5" holds, 4# (w/PT facilitation) Prone  Other Prone Exercises: TKE 10x5 sec holds (PT Facilation)   Modalities Modalities: Cryotherapy Manual Therapy Manual Therapy: Joint mobilization Joint Mobilization: Grade III to Lt fibular head to improve mobility.  Myofascial Release: Supine and Prone to Lt knee: fibular head, popliteal region distal quadricep, patella mobs and quadriceps muscle to decrease fascial restrictions Cryotherapy Number Minutes Cryotherapy: 10 Minutes Cryotherapy Location: Knee Type of Cryotherapy: Ice pack  Physical Therapy Assessment and Plan PT Assessment and Plan Clinical Impression Statement: Had signfiicant popliteal trigger point to Lt knee which resolved by 75% after manual techniques.  Educated pt on HEP she can  complete at work in a standing position and updated with pictures.  Continues to require max VC for proper knee extension activation with decreased coordinated movement noted to distal Lt quadricep. Pt will f/u with MD today.  PT Plan: Manual techniques to decrease fascial restrictions and improve AROM with stretching and functional AROM activities.  Add seated LAQ for TKE.     Goals    Problem List Patient Active Problem List   Diagnosis Date Noted  . Stiffness of joint, not elsewhere classified, lower leg 07/28/2013  . Pain in knee 07/28/2013  . Swelling of joint of left knee 07/28/2013    PT - End of Session Activity Tolerance: Patient tolerated treatment well General Behavior During Therapy: WFL for tasks assessed/performed PT Plan of Care PT Home Exercise Plan: Updated PT Patient Instructions: HEP for work [employer] and Agree with Plan of Care: Patient  GP Functional Assessment Tool Used: FOTO: 60/40  Leomia Blake, MPT, ATC 09/01/2013, 9:34 AM

## 2013-09-04 ENCOUNTER — Ambulatory Visit (HOSPITAL_COMMUNITY)
Admission: RE | Admit: 2013-09-04 | Discharge: 2013-09-04 | Disposition: A | Discharge status: Home / Self Care | Payer: Medicare Other | Source: Ambulatory Visit | Attending: Internal Medicine | Admitting: Internal Medicine

## 2013-09-04 NOTE — Progress Notes (Signed)
Physical Therapy Treatment Patient Details  Name: Alison House MRN: [id number] Date of Birth: 1945-10-25  Today's Date: 09/04/2013 Time: 1425-1520 PT Time Calculation (min): 55 min  Visit#: 10 of 12  Re-eval: 09/03/13 Authorization: BCBS Medicare  Authorization Visit#: 10 of 18  Charges:  therex 15', manual 25', ice 10'  Subjective: Symptoms/Limitations Symptoms: Pt states she is doing so well.  States she continues to do her HEP and is using her stick to help decrease adhesions.  Pt reports MD has released her from care.  Pt requests to finish her last 2 visits then will be ready for discharge to HEP. Pain Assessment Currently in Pain?: No/denies   Exercise/Treatments Standing Terminal Knee Extension: Left;15 reps Other Standing Knee Exercises: Heel walking 2 RT Prone  Other Prone Exercises: TKE 15x5 sec holds   Modalities Modalities: Cryotherapy Manual Therapy Manual Therapy: Myofascial release Myofascial Release: Supine and Prone to Lt knee: fibular head, popliteal region distal quadricep, patella mobs and quadriceps muscle to decrease fascial restrictions Cryotherapy Number Minutes Cryotherapy: 10 Minutes Cryotherapy Location: Knee Type of Cryotherapy: Ice pack  Physical Therapy Assessment and Plan PT Assessment and Plan PT Assessment:  Continued to focus on manual techniques to decrease adhesions.  Most tightness medial Lt knee.  Encouraged to continue HEP, rolling and icing as needed. PT Plan: Focus on Manual techniques to decrease fascial restrictions and improve AROM with stretching and functional AROM activities.  Continue X visits then discharge.      Problem List Patient Active Problem List   Diagnosis Date Noted  . Stiffness of joint, not elsewhere classified, lower leg 07/28/2013  . Pain in knee 07/28/2013  . Swelling of joint of left knee 07/28/2013    PT Plan of Care PT Home Exercise Plan: Updated PT Patient Instructions: HEP for  work [employer] and Agree with Plan of Care: Patient  GP Functional Assessment Tool Used: FOTO: 60/40  Lurena Nida, PTA/CLT 09/04/2013, 3:57 PM

## 2013-09-08 ENCOUNTER — Ambulatory Visit (HOSPITAL_COMMUNITY)
Admission: RE | Admit: 2013-09-08 | Discharge: 2013-09-08 | Disposition: A | Discharge status: Home / Self Care | Payer: Medicare Other | Source: Ambulatory Visit | Attending: Internal Medicine | Admitting: Internal Medicine

## 2013-09-08 DIAGNOSIS — M25462 Effusion, left knee: Secondary | ICD-10-CM

## 2013-09-08 DIAGNOSIS — M25669 Stiffness of unspecified knee, not elsewhere classified: Secondary | ICD-10-CM

## 2013-09-08 DIAGNOSIS — M25562 Pain in left knee: Secondary | ICD-10-CM

## 2013-09-08 NOTE — Progress Notes (Signed)
Physical Therapy Treatment Patient Details  Name: Alison House MRN: [id number] Date of Birth: 1946/03/14  Today's Date: 09/08/2013 Time: 1420-1520 PT Time Calculation (min): 60 min Charge:Therex 1420-1430, Manual [id number], Ice [id number]  Visit#: 11 of 12  Re-eval: 09/03/13 Assessment Diagnosis: Lt knee arthroscopy Surgical Date: 07/10/13 Next MD Visit: Dr. Thurston Hole - 09/09/13? Prior Therapy: none  Authorization: BCBS Medicare  Authorization Time Period:    Authorization Visit#: 11 of 18   Subjective: Symptoms/Limitations Symptoms: Pt reports she is doing good, no pain just tightness. Pain Assessment Currently in Pain?: No/denies  Objective:   Exercise/Treatments Stretches ITB Stretch: 3 reps;30 seconds Standing Other Standing Knee Exercises: Heel walking 2 RT Supine Quad Sets: 10 reps;Limitations Quad Sets Limitations: 10" holds Prone  Other Prone Exercises: TKE 15x5 sec holds   Manual Therapy Manual Therapy: Myofascial release Myofascial Release: Supine and Prone to Lt knee: fibular head, popliteal region distal quadricep, ITBand patella mobs and quadriceps and gastorc muscle to decrease fascial restrictions Cryotherapy Number Minutes Cryotherapy: 10 Minutes Cryotherapy Location: Knee Type of Cryotherapy: Ice pack  Physical Therapy Assessment and Plan PT Assessment and Plan Clinical Impression Statement: Continued session focus on manual techniques to reduce tightness and adhesionx to popliteal, quad, IT band and gastorc region.  Instructed ITBand stretch to improve flexibility.  Pt with max therapist facilitation to improve coordination of quadricep contraction, encouraged pt to increase sets HEP.  PT Plan: Re-eval next session.  Focus on Manual techniques to decrease fascial restrictions and improve AROM with stretching and functional AROM activities.      Goals Home Exercise Program Pt/caregiver will Perform Home Exercise Program: For increased  strengthening;For increased ROM PT Short Term Goals PT Short Term Goal 1: Pt ROM to be 5-110 to allow more normalized gait pattern PT Short Term Goal 1 - Progress: Progressing toward goal PT Short Term Goal 2: Pt to be able to sit for an hour to enjoy a meal at a restaurant (no difficulty sitting) PT Long Term Goals Time to Complete Long Term Goals: 4 weeks PT Long Term Goal 1: Pt to be completing an advanced HEP PT Long Term Goal 2: Pt ROM to be 0 -125 to allow squatting activites Long Term Goal 3: Pt to RTW with no difficulty  Long Term Goal 3 Progress: Met (RTW 08/03/2013) Long Term Goal 4: Strength wnl to be able to go up and down steps in a reciprocal manner  Problem List Patient Active Problem List   Diagnosis Date Noted  . Stiffness of joint, not elsewhere classified, lower leg 07/28/2013  . Pain in knee 07/28/2013  . Swelling of joint of left knee 07/28/2013    PT - End of Session Activity Tolerance: Patient tolerated treatment well General Behavior During Therapy: [facility] for tasks assessed/performed  GP    Juel Burrow 09/08/2013, 3:34 PM

## 2013-09-10 ENCOUNTER — Ambulatory Visit (HOSPITAL_COMMUNITY)
Admission: RE | Admit: 2013-09-10 | Discharge: 2013-09-10 | Disposition: A | Discharge status: Home / Self Care | Payer: Medicare Other | Source: Ambulatory Visit | Attending: Internal Medicine | Admitting: Internal Medicine

## 2013-09-10 DIAGNOSIS — M25462 Effusion, left knee: Secondary | ICD-10-CM

## 2013-09-10 DIAGNOSIS — M25562 Pain in left knee: Secondary | ICD-10-CM

## 2013-09-10 DIAGNOSIS — M25669 Stiffness of unspecified knee, not elsewhere classified: Secondary | ICD-10-CM

## 2013-09-10 NOTE — Evaluation (Signed)
Physical Therapy Treatment/Discharge  Patient Details  Name: Alison House MRN: [id number] Date of Birth: 01/01/1946  Today's Date: 09/10/2013 Time: 0865-7846 PT Time Calculation (min): 53 min Charges: 1 ROM Self Care: 9629-5284 Manual: 1528-1600 Ice: 1              Visit#: 12 of 12  Re-eval: 09/03/13 Assessment Diagnosis: Lt knee arthroscopy Surgical Date: 07/10/13 Next MD Visit: Dr. Thurston Hole - unscheduled  Authorization: BCBS Medicare    Authorization Time Period:    Authorization Visit#: 12 of 18   Subjective Symptoms/Limitations Symptoms: Repores only stiff in the morning.  is independent using her massage stick.  she feels it will take time. has returned to work.  is ready for d/c Pain Assessment Currently in Pain?: No/denies    Sensation/Coordination/Flexibility/Functional Tests Functional Tests Functional Tests: FOTO: 78/22  Assessment LLE PROM (degrees) Left Knee Extension: 0 (was 0) Left Knee Flexion: 115 (was 115)  Exercise/Treatments  Modalities Modalities: Cryotherapy Manual Therapy Manual Therapy: Myofascial release Myofascial Release: Supine and Prone to Lt knee: fibular head, popliteal region distal quadricep, ITBand patella mobs and quadriceps and gastorc muscle to decrease fascial restrictions Cryotherapy Number Minutes Cryotherapy: 10 Minutes Cryotherapy Location: Knee Type of Cryotherapy: Ice pack  Physical Therapy Assessment and Plan PT Assessment and Plan Clinical Impression Statement: Alison House has attended 12 OP PT visits over the past 6 weeks with the following findings: she has improved her functional AROM and her strength is 5/5 thorughout.  Has been educated and has purchased self massage tools which has helped to decrease her stiffness and pain. At this time pt is ready for D/C from PT.  PT Plan: D/C    Goals Home Exercise Program Pt/caregiver will Perform Home Exercise Program: For increased strengthening;For increased ROM PT  Goal: Perform Home Exercise Program - Progress: Met PT Short Term Goals PT Short Term Goal 1: Pt ROM to be 5-110 to allow more normalized gait pattern PT Short Term Goal 1 - Progress: Met PT Short Term Goal 2: Pt to be able to sit for an hour to enjoy a meal at a restaurant (no difficulty sitting) PT Short Term Goal 2 - Progress: Met PT Long Term Goals Time to Complete Long Term Goals: 4 weeks PT Long Term Goal 1: Pt to be completing an advanced HEP PT Long Term Goal 1 - Progress: Met PT Long Term Goal 2: Pt ROM to be 0 -125 to allow squatting activites PT Long Term Goal 2 - Progress: Met Long Term Goal 3: Pt to RTW with no difficulty  Long Term Goal 3 Progress: Met Long Term Goal 4: Strength wnl to be able to go up and down steps in a reciprocal manner Long Term Goal 4 Progress: Met  Problem List Patient Active Problem List   Diagnosis Date Noted  . Stiffness of joint, not elsewhere classified, lower leg 07/28/2013  . Pain in knee 07/28/2013  . Swelling of joint of left knee 07/28/2013    PT - End of Session Activity Tolerance: Patient tolerated treatment well General Behavior During Therapy: [facility] for tasks assessed/performed PT Plan of Care PT Patient Instructions: discussed FOTO and maintance massage Consulted and Agree with Plan of Care: Patient  GP Functional Assessment Tool Used: FOTO: 78/22 Functional Limitation: Changing and maintaining body position Changing and Maintaining Body Position Goal Status (X3244): At least 20 percent but less than 40 percent impaired, limited or restricted Changing and Maintaining Body Position Discharge Status [phone]): At least 20 percent  but less than 40 percent impaired, limited or restricted  Rayah Fines, MPT, ATC 09/10/2013, 4:16 PM  Physician Documentation Your signature is required to indicate approval of the treatment plan as stated above.  Please sign and either send electronically or make a copy of this report for your files and  return this physician signed original.   Please mark one 1.__approve of plan  2. ___approve of plan with the following conditions.   ______________________________                                                          _____________________ Physician Signature                                                                                                             Date

## 2013-09-11 ENCOUNTER — Ambulatory Visit (HOSPITAL_COMMUNITY): Payer: Medicare Other

## 2013-09-15 ENCOUNTER — Ambulatory Visit (HOSPITAL_COMMUNITY): Payer: Medicare Other | Admitting: Physical Therapy

## 2013-09-18 ENCOUNTER — Ambulatory Visit (HOSPITAL_COMMUNITY): Payer: Medicare Other | Admitting: Physical Therapy

## 2013-11-18 ENCOUNTER — Other Ambulatory Visit (HOSPITAL_COMMUNITY): Payer: Self-pay | Admitting: Internal Medicine

## 2013-11-18 DIAGNOSIS — Z139 Encounter for screening, unspecified: Secondary | ICD-10-CM

## 2014-01-15 ENCOUNTER — Ambulatory Visit (HOSPITAL_COMMUNITY)
Admission: RE | Admit: 2014-01-15 | Discharge: 2014-01-15 | Disposition: A | Discharge status: Home / Self Care | Payer: Medicare HMO | Source: Ambulatory Visit | Attending: Internal Medicine | Admitting: Internal Medicine

## 2014-01-15 DIAGNOSIS — Z1231 Encounter for screening mammogram for malignant neoplasm of breast: Secondary | ICD-10-CM | POA: Diagnosis not present

## 2014-01-15 DIAGNOSIS — Z139 Encounter for screening, unspecified: Secondary | ICD-10-CM

## 2014-04-30 ENCOUNTER — Encounter (HOSPITAL_COMMUNITY): Payer: Self-pay | Admitting: Emergency Medicine

## 2014-04-30 ENCOUNTER — Emergency Department (HOSPITAL_COMMUNITY)
Admission: EM | Admit: 2014-04-30 | Discharge: 2014-04-30 | Disposition: A | Discharge status: Home / Self Care | Payer: Medicare HMO | Attending: Emergency Medicine | Admitting: Emergency Medicine

## 2014-04-30 DIAGNOSIS — R21 Rash and other nonspecific skin eruption: Secondary | ICD-10-CM | POA: Diagnosis present

## 2014-04-30 DIAGNOSIS — L259 Unspecified contact dermatitis, unspecified cause: Secondary | ICD-10-CM | POA: Insufficient documentation

## 2014-04-30 MED ORDER — PREDNISONE 50 MG PO TABS
ORAL_TABLET | ORAL | Status: DC
Start: 2014-04-30 — End: 2016-12-10

## 2014-04-30 MED ORDER — HYDROCORTISONE 1 % EX CREA
TOPICAL_CREAM | CUTANEOUS | Status: DC
Start: 2014-04-30 — End: 2016-12-10

## 2014-04-30 MED ORDER — DIPHENHYDRAMINE HCL 25 MG PO TABS: 25.0000 mg | ORAL_TABLET | Freq: Four times a day (QID) | ORAL | Status: DC

## 2014-04-30 NOTE — ED Notes (Signed)
Rash rash right side of neck and under left arm.   Thinks she may have shingles.

## 2014-04-30 NOTE — Discharge Instructions (Signed)

## 2014-04-30 NOTE — ED Provider Notes (Signed)
CSN: [id number]     Arrival date & time 04/30/14  1538 History   First MD Initiated Contact with Patient 04/30/14 1550    This chart was scribed for No att. providers found by Marica Otter, ED Scribe. This patient was seen in room APA07/APA07 and the patient's care was started at 4:48 PM.  Chief Complaint  Patient presents with  . Rash   PCP: Carylon Perches, MD  The history is provided by the patient. No language interpreter was used.   HPI Comments: Alison House is a 68 y.o. female who presents to the Emergency Department complaining of rash to the right side of her neck spreading to the back of her head onset last night. Pt reports the areas are very itchy, however, denies any burning sensation. Pt denies exposure to any new substance or change in any hygiene products. Pt denies any new meds. Pt reports she has been taking benadryl and applying OTC topical anti itch creams to the affected areas without much relief. Pt denies chest pain, SOB, new tick bites. Pt reports in 1999 she was treated for skin irritation that she believes was shingles.    History reviewed. No pertinent past medical history. Past Surgical History  Procedure Laterality Date  . Knee arthroscopy     History reviewed. No pertinent family history. History  Substance Use Topics  . Smoking status: Never Smoker   . Smokeless tobacco: Not on file  . Alcohol Use: No   OB History   Grav Para Term Preterm Abortions TAB SAB Ect Mult Living                 Review of Systems A complete 10 system review of systems was obtained and all systems are negative except as noted in the HPI and PMH.    Allergies  Sulfur  Home Medications   Prior to Admission medications   Medication Sig Start Date End Date Taking? Authorizing Provider  diphenhydrAMINE (BENADRYL) 25 MG tablet Take 1 tablet (25 mg total) by mouth every 6 (six) hours. 04/30/14   Glynn Octave, MD  hydrocortisone cream 1 % Apply to affected area 2 times daily  04/30/14   Glynn Octave, MD  predniSONE (DELTASONE) 50 MG tablet 1 tablet PO daily 04/30/14   Glynn Octave, MD   Triage Vitals: BP 138/68  Pulse 98  Temp(Src) 98.1 F (36.7 C) (Oral)  Resp 18  Ht 5\' 5"  (1.651 m)  Wt 210 lb (95.255 kg)  BMI 34.95 kg/m2  SpO2 96% Physical Exam  Nursing note and vitals reviewed. Constitutional: She is oriented to person, place, and time. She appears well-developed and well-nourished. No distress.  HENT:  Head: Normocephalic and atraumatic.  Mouth/Throat: Oropharynx is clear and moist. No oropharyngeal exudate.  Eyes: Conjunctivae and EOM are normal. Pupils are equal, round, and reactive to light.  Neck: Normal range of motion. Neck supple.  No meningismus.  Cardiovascular: Normal rate, regular rhythm, normal heart sounds and intact distal pulses.   No murmur heard. Pulmonary/Chest: Effort normal and breath sounds normal. No respiratory distress.  Abdominal: Soft. She exhibits no distension. There is no tenderness. There is no rebound and no guarding.  Musculoskeletal: Normal range of motion. She exhibits no edema and no tenderness.  Neurological: She is alert and oriented to person, place, and time. No cranial nerve deficit. She exhibits normal muscle tone. Coordination normal.  No ataxia on finger to nose bilaterally. No pronator drift. 5/5 strength throughout. CN 2-12 intact. Negative Romberg.  Equal grip strength. Sensation intact. Gait is normal.   Skin: Skin is warm. Rash noted.  Erythematous macular rash to right neck with overlying trauma from scratching, patchy erythema to posterior left neck and left upper arm. No vesicles.   Psychiatric: She has a normal mood and affect. Her behavior is normal.    ED Course  Procedures (including critical care time) DIAGNOSTIC STUDIES: Oxygen Saturation is  96% on RA, nl by my interpretation.    COORDINATION OF CARE: 4:01 PM-Discussed treatment plan which includes steroids and Rx for shingles meds  with pt at bedside and pt agreed to plan.   Labs Review Labs Reviewed - No data to display  Imaging Review No results found.   EKG Interpretation None      MDM   Final diagnoses:  Contact dermatitis   Itchy rash to right side of neck for the past one day. Also erythema to left posterior neck and left upper arm. Denies new exposures.  Does not appear to be consistent with shingles. It is bilateral. There is no vesicles. Appears to be contact dermatitis we'll treat with steroids and antihistamines. Follow up with PCP.  I personally performed the services described in this documentation, which was scribed in my presence. The recorded information has been reviewed and is accurate.   Glynn OctaveStephen Alam Guterrez, MD 05/01/14 [phone]

## 2014-12-15 ENCOUNTER — Other Ambulatory Visit (HOSPITAL_COMMUNITY): Payer: Self-pay | Admitting: Internal Medicine

## 2014-12-15 DIAGNOSIS — Z1231 Encounter for screening mammogram for malignant neoplasm of breast: Secondary | ICD-10-CM

## 2015-02-04 ENCOUNTER — Ambulatory Visit (HOSPITAL_COMMUNITY): Payer: Medicare HMO

## 2015-02-18 ENCOUNTER — Ambulatory Visit (HOSPITAL_COMMUNITY)
Admission: RE | Admit: 2015-02-18 | Discharge: 2015-02-18 | Disposition: A | Discharge status: Home / Self Care | Payer: Commercial Managed Care - HMO | Source: Ambulatory Visit | Attending: Internal Medicine | Admitting: Internal Medicine

## 2015-02-18 DIAGNOSIS — Z1231 Encounter for screening mammogram for malignant neoplasm of breast: Secondary | ICD-10-CM | POA: Diagnosis not present

## 2015-05-06 DIAGNOSIS — Z01419 Encounter for gynecological examination (general) (routine) without abnormal findings: Secondary | ICD-10-CM | POA: Diagnosis not present

## 2015-06-13 DIAGNOSIS — R21 Rash and other nonspecific skin eruption: Secondary | ICD-10-CM | POA: Diagnosis not present

## 2015-06-13 DIAGNOSIS — Z6834 Body mass index (BMI) 34.0-34.9, adult: Secondary | ICD-10-CM | POA: Diagnosis not present

## 2015-06-29 DIAGNOSIS — J019 Acute sinusitis, unspecified: Secondary | ICD-10-CM | POA: Diagnosis not present

## 2015-06-29 DIAGNOSIS — Z6835 Body mass index (BMI) 35.0-35.9, adult: Secondary | ICD-10-CM | POA: Diagnosis not present

## 2015-07-12 DIAGNOSIS — K219 Gastro-esophageal reflux disease without esophagitis: Secondary | ICD-10-CM | POA: Diagnosis not present

## 2015-07-12 DIAGNOSIS — Z79899 Other long term (current) drug therapy: Secondary | ICD-10-CM | POA: Diagnosis not present

## 2015-07-12 DIAGNOSIS — M1990 Unspecified osteoarthritis, unspecified site: Secondary | ICD-10-CM | POA: Diagnosis not present

## 2015-07-12 DIAGNOSIS — G4700 Insomnia, unspecified: Secondary | ICD-10-CM | POA: Diagnosis not present

## 2015-07-25 DIAGNOSIS — Z6835 Body mass index (BMI) 35.0-35.9, adult: Secondary | ICD-10-CM | POA: Diagnosis not present

## 2015-07-25 DIAGNOSIS — G4700 Insomnia, unspecified: Secondary | ICD-10-CM | POA: Diagnosis not present

## 2015-07-25 DIAGNOSIS — Z23 Encounter for immunization: Secondary | ICD-10-CM | POA: Diagnosis not present

## 2015-07-25 DIAGNOSIS — M1990 Unspecified osteoarthritis, unspecified site: Secondary | ICD-10-CM | POA: Diagnosis not present

## 2015-07-25 DIAGNOSIS — F419 Anxiety disorder, unspecified: Secondary | ICD-10-CM | POA: Diagnosis not present

## 2015-08-01 ENCOUNTER — Other Ambulatory Visit (HOSPITAL_COMMUNITY): Payer: Self-pay | Admitting: Internal Medicine

## 2015-08-01 DIAGNOSIS — Z780 Asymptomatic menopausal state: Secondary | ICD-10-CM

## 2015-08-09 ENCOUNTER — Ambulatory Visit (HOSPITAL_COMMUNITY)
Admission: RE | Admit: 2015-08-09 | Discharge: 2015-08-09 | Disposition: A | Discharge status: Home / Self Care | Payer: Commercial Managed Care - HMO | Source: Ambulatory Visit | Attending: Internal Medicine | Admitting: Internal Medicine

## 2015-08-09 DIAGNOSIS — Z780 Asymptomatic menopausal state: Secondary | ICD-10-CM | POA: Diagnosis not present

## 2015-08-09 DIAGNOSIS — M810 Age-related osteoporosis without current pathological fracture: Secondary | ICD-10-CM | POA: Diagnosis not present

## 2015-08-22 DIAGNOSIS — J069 Acute upper respiratory infection, unspecified: Secondary | ICD-10-CM | POA: Diagnosis not present

## 2015-08-22 DIAGNOSIS — Z6835 Body mass index (BMI) 35.0-35.9, adult: Secondary | ICD-10-CM | POA: Diagnosis not present

## 2015-10-18 DIAGNOSIS — M79676 Pain in unspecified toe(s): Secondary | ICD-10-CM | POA: Diagnosis not present

## 2015-10-18 DIAGNOSIS — B351 Tinea unguium: Secondary | ICD-10-CM | POA: Diagnosis not present

## 2015-12-08 DIAGNOSIS — B351 Tinea unguium: Secondary | ICD-10-CM | POA: Diagnosis not present

## 2015-12-08 DIAGNOSIS — M79676 Pain in unspecified toe(s): Secondary | ICD-10-CM | POA: Diagnosis not present

## 2015-12-13 ENCOUNTER — Ambulatory Visit (HOSPITAL_COMMUNITY)
Admission: RE | Admit: 2015-12-13 | Discharge: 2015-12-13 | Disposition: A | Discharge status: Home / Self Care | Payer: Commercial Managed Care - HMO | Source: Ambulatory Visit | Attending: Internal Medicine | Admitting: Internal Medicine

## 2015-12-13 ENCOUNTER — Other Ambulatory Visit (HOSPITAL_COMMUNITY): Payer: Self-pay | Admitting: Internal Medicine

## 2015-12-13 DIAGNOSIS — M79601 Pain in right arm: Secondary | ICD-10-CM

## 2015-12-13 DIAGNOSIS — Z6834 Body mass index (BMI) 34.0-34.9, adult: Secondary | ICD-10-CM | POA: Diagnosis not present

## 2015-12-13 DIAGNOSIS — M47814 Spondylosis without myelopathy or radiculopathy, thoracic region: Secondary | ICD-10-CM | POA: Diagnosis not present

## 2015-12-13 DIAGNOSIS — M5410 Radiculopathy, site unspecified: Secondary | ICD-10-CM | POA: Insufficient documentation

## 2015-12-13 DIAGNOSIS — M5134 Other intervertebral disc degeneration, thoracic region: Secondary | ICD-10-CM | POA: Diagnosis not present

## 2015-12-13 DIAGNOSIS — M549 Dorsalgia, unspecified: Secondary | ICD-10-CM | POA: Diagnosis not present

## 2015-12-13 DIAGNOSIS — M546 Pain in thoracic spine: Secondary | ICD-10-CM | POA: Diagnosis not present

## 2015-12-13 DIAGNOSIS — M50322 Other cervical disc degeneration at C5-C6 level: Secondary | ICD-10-CM | POA: Diagnosis not present

## 2016-02-02 DIAGNOSIS — B351 Tinea unguium: Secondary | ICD-10-CM | POA: Diagnosis not present

## 2016-02-02 DIAGNOSIS — M79676 Pain in unspecified toe(s): Secondary | ICD-10-CM | POA: Diagnosis not present

## 2016-02-06 ENCOUNTER — Other Ambulatory Visit (HOSPITAL_COMMUNITY): Payer: Self-pay | Admitting: Internal Medicine

## 2016-02-06 DIAGNOSIS — Z1231 Encounter for screening mammogram for malignant neoplasm of breast: Secondary | ICD-10-CM

## 2016-02-20 ENCOUNTER — Ambulatory Visit (HOSPITAL_COMMUNITY)
Admission: RE | Admit: 2016-02-20 | Discharge: 2016-02-20 | Disposition: A | Discharge status: Home / Self Care | Payer: Commercial Managed Care - HMO | Source: Ambulatory Visit | Attending: Internal Medicine | Admitting: Internal Medicine

## 2016-02-20 DIAGNOSIS — Z1231 Encounter for screening mammogram for malignant neoplasm of breast: Secondary | ICD-10-CM | POA: Diagnosis not present

## 2016-02-28 DIAGNOSIS — L258 Unspecified contact dermatitis due to other agents: Secondary | ICD-10-CM | POA: Diagnosis not present

## 2016-03-14 DIAGNOSIS — J069 Acute upper respiratory infection, unspecified: Secondary | ICD-10-CM | POA: Diagnosis not present

## 2016-05-09 ENCOUNTER — Encounter (INDEPENDENT_AMBULATORY_CARE_PROVIDER_SITE_OTHER): Payer: Self-pay | Admitting: *Deleted

## 2016-06-22 DIAGNOSIS — J069 Acute upper respiratory infection, unspecified: Secondary | ICD-10-CM | POA: Diagnosis not present

## 2016-06-28 DIAGNOSIS — R05 Cough: Secondary | ICD-10-CM | POA: Diagnosis not present

## 2016-08-14 DIAGNOSIS — F419 Anxiety disorder, unspecified: Secondary | ICD-10-CM | POA: Diagnosis not present

## 2016-08-14 DIAGNOSIS — Z79899 Other long term (current) drug therapy: Secondary | ICD-10-CM | POA: Diagnosis not present

## 2016-08-14 DIAGNOSIS — M1990 Unspecified osteoarthritis, unspecified site: Secondary | ICD-10-CM | POA: Diagnosis not present

## 2016-08-14 DIAGNOSIS — K219 Gastro-esophageal reflux disease without esophagitis: Secondary | ICD-10-CM | POA: Diagnosis not present

## 2016-08-14 DIAGNOSIS — G4700 Insomnia, unspecified: Secondary | ICD-10-CM | POA: Diagnosis not present

## 2016-08-21 DIAGNOSIS — G4700 Insomnia, unspecified: Secondary | ICD-10-CM | POA: Diagnosis not present

## 2016-08-21 DIAGNOSIS — Z6835 Body mass index (BMI) 35.0-35.9, adult: Secondary | ICD-10-CM | POA: Diagnosis not present

## 2016-08-21 DIAGNOSIS — Z0001 Encounter for general adult medical examination with abnormal findings: Secondary | ICD-10-CM | POA: Diagnosis not present

## 2016-08-21 DIAGNOSIS — M1990 Unspecified osteoarthritis, unspecified site: Secondary | ICD-10-CM | POA: Diagnosis not present

## 2016-08-21 DIAGNOSIS — Z23 Encounter for immunization: Secondary | ICD-10-CM | POA: Diagnosis not present

## 2016-09-11 ENCOUNTER — Encounter (INDEPENDENT_AMBULATORY_CARE_PROVIDER_SITE_OTHER): Payer: Self-pay | Admitting: *Deleted

## 2016-09-11 ENCOUNTER — Encounter (INDEPENDENT_AMBULATORY_CARE_PROVIDER_SITE_OTHER): Payer: Self-pay

## 2016-10-05 DIAGNOSIS — Z23 Encounter for immunization: Secondary | ICD-10-CM | POA: Diagnosis not present

## 2016-11-07 ENCOUNTER — Other Ambulatory Visit (INDEPENDENT_AMBULATORY_CARE_PROVIDER_SITE_OTHER): Payer: Self-pay | Admitting: *Deleted

## 2016-11-07 DIAGNOSIS — Z860100 Personal history of colon polyps, unspecified: Secondary | ICD-10-CM | POA: Insufficient documentation

## 2016-11-16 ENCOUNTER — Telehealth (INDEPENDENT_AMBULATORY_CARE_PROVIDER_SITE_OTHER): Payer: Self-pay | Admitting: *Deleted

## 2016-11-16 NOTE — Telephone Encounter (Signed)
Referring MD/PCP: fagan   Procedure: tcs  Reason/Indication:  Hx polyps  Has patient had this procedure before?  Yes, 2012 & 2004  If so, when, by whom and where?    Is there a family history of colon cancer?  no  Who?  What age when diagnosed?    Is patient diabetic?   no      Does patient have prosthetic heart valve or mechanical valve?  no  Do you have a pacemaker?  no  Has patient ever had endocarditis? no  Has patient had joint replacement within last 12 months?  no  Does patient tend to be constipated or take laxatives? no  Does patient have a history of alcohol/drug use?  no  Is patient on Coumadin, Plavix and/or Aspirin? no  Medications: lorazepam 0.5 mg prn, trazadone 50 mg nightly, vit c bid, vit b12 daily, glucosamine 1500 mg daily, vit d3 daily    NO NEED FOR PROPOFOL PER DR REHMAN  Allergies: nkda  Medication Adjustment:   Procedure date & time: 12/13/16 at 730

## 2016-11-19 NOTE — Telephone Encounter (Signed)
agree

## 2016-11-27 DIAGNOSIS — Z01419 Encounter for gynecological examination (general) (routine) without abnormal findings: Secondary | ICD-10-CM | POA: Diagnosis not present

## 2016-11-27 DIAGNOSIS — Z124 Encounter for screening for malignant neoplasm of cervix: Secondary | ICD-10-CM | POA: Diagnosis not present

## 2016-11-27 DIAGNOSIS — Z8041 Family history of malignant neoplasm of ovary: Secondary | ICD-10-CM | POA: Diagnosis not present

## 2016-12-10 ENCOUNTER — Encounter (INDEPENDENT_AMBULATORY_CARE_PROVIDER_SITE_OTHER): Payer: Self-pay | Admitting: *Deleted

## 2016-12-10 ENCOUNTER — Telehealth (INDEPENDENT_AMBULATORY_CARE_PROVIDER_SITE_OTHER): Payer: Self-pay | Admitting: *Deleted

## 2016-12-10 MED ORDER — PEG 3350-KCL-NA BICARB-NACL 420 G PO SOLR: 4000.0000 mL | Freq: Once | ORAL | 0 refills | Status: AC

## 2016-12-10 NOTE — Telephone Encounter (Signed)
Patient needs trilyte 

## 2016-12-12 DIAGNOSIS — Z803 Family history of malignant neoplasm of breast: Secondary | ICD-10-CM | POA: Diagnosis not present

## 2016-12-12 DIAGNOSIS — Z8041 Family history of malignant neoplasm of ovary: Secondary | ICD-10-CM | POA: Diagnosis not present

## 2016-12-13 ENCOUNTER — Encounter (HOSPITAL_COMMUNITY)
Admission: RE | Disposition: A | Discharge status: Home Health | Payer: Self-pay | Source: Ambulatory Visit | Attending: Internal Medicine

## 2016-12-13 ENCOUNTER — Encounter (HOSPITAL_COMMUNITY): Payer: Self-pay | Admitting: *Deleted

## 2016-12-13 ENCOUNTER — Ambulatory Visit (HOSPITAL_COMMUNITY)
Admission: RE | Admit: 2016-12-13 | Discharge: 2016-12-13 | Disposition: A | Discharge status: Home Health | Payer: Medicare HMO | Source: Ambulatory Visit | Attending: Internal Medicine | Admitting: Internal Medicine

## 2016-12-13 DIAGNOSIS — Z09 Encounter for follow-up examination after completed treatment for conditions other than malignant neoplasm: Secondary | ICD-10-CM | POA: Diagnosis not present

## 2016-12-13 DIAGNOSIS — Z1211 Encounter for screening for malignant neoplasm of colon: Secondary | ICD-10-CM | POA: Insufficient documentation

## 2016-12-13 DIAGNOSIS — Z79899 Other long term (current) drug therapy: Secondary | ICD-10-CM | POA: Diagnosis not present

## 2016-12-13 DIAGNOSIS — F419 Anxiety disorder, unspecified: Secondary | ICD-10-CM | POA: Diagnosis not present

## 2016-12-13 DIAGNOSIS — K5730 Diverticulosis of large intestine without perforation or abscess without bleeding: Secondary | ICD-10-CM | POA: Insufficient documentation

## 2016-12-13 DIAGNOSIS — Z860100 Personal history of colon polyps, unspecified: Secondary | ICD-10-CM | POA: Insufficient documentation

## 2016-12-13 DIAGNOSIS — K6289 Other specified diseases of anus and rectum: Secondary | ICD-10-CM | POA: Insufficient documentation

## 2016-12-13 DIAGNOSIS — K644 Residual hemorrhoidal skin tags: Secondary | ICD-10-CM | POA: Insufficient documentation

## 2016-12-13 DIAGNOSIS — Z791 Long term (current) use of non-steroidal anti-inflammatories (NSAID): Secondary | ICD-10-CM | POA: Diagnosis not present

## 2016-12-13 HISTORY — DX: Anxiety disorder, unspecified: F41.9

## 2016-12-13 HISTORY — DX: Unspecified asthma, uncomplicated: J45.909

## 2016-12-13 HISTORY — PX: COLONOSCOPY: SHX5424

## 2016-12-13 SURGERY — COLONOSCOPY
Anesthesia: Moderate Sedation

## 2016-12-13 MED ORDER — SODIUM CHLORIDE 0.9 % IV SOLN
INTRAVENOUS | Status: DC
  Administered 2016-12-13: 07:00:00 via INTRAVENOUS

## 2016-12-13 MED ORDER — MEPERIDINE HCL 50 MG/ML IJ SOLN
INTRAMUSCULAR | Status: DC | PRN
  Administered 2016-12-13 (×2): 25 mg via INTRAVENOUS

## 2016-12-13 MED ORDER — SIMETHICONE 40 MG/0.6ML PO SUSP
ORAL | Status: AC
  Filled 2016-12-13: qty 30

## 2016-12-13 MED ORDER — MIDAZOLAM HCL 5 MG/5ML IJ SOLN
INTRAMUSCULAR | Status: AC
  Filled 2016-12-13: qty 10

## 2016-12-13 MED ORDER — MIDAZOLAM HCL 5 MG/5ML IJ SOLN
INTRAMUSCULAR | Status: DC | PRN
  Administered 2016-12-13 (×3): 2 mg via INTRAVENOUS

## 2016-12-13 MED ORDER — MEPERIDINE HCL 50 MG/ML IJ SOLN
INTRAMUSCULAR | Status: AC
  Filled 2016-12-13: qty 1

## 2016-12-13 NOTE — Op Note (Signed)
[facility] Patient Name: Alison House Procedure Date: 12/13/2016 7:34 AM MRN: [id number] Date of Birth: 22-Jul-1946 Attending MD: Lionel December , MD CSN: [id number] Age: 71 Admit Type: Outpatient Procedure:                Colonoscopy Indications:              High risk colon cancer surveillance: Personal                            history of colonic polyps Providers:                Lionel December, MD, Nena Polio, RN, Lollie Marrow. Lake,                            Technician Referring MD:             Carylon Perches, MD Medicines:                Meperidine 50 mg IV, Midazolam 6 mg IV Complications:            No immediate complications. Estimated Blood Loss:     Estimated blood loss: none. Procedure:                Pre-Anesthesia Assessment:                           - Prior to the procedure, a History and Physical                            was performed, and patient medications and                            allergies were reviewed. The patient's tolerance of                            previous anesthesia was also reviewed. The risks                            and benefits of the procedure and the sedation                            options and risks were discussed with the patient.                            All questions were answered, and informed consent                            was obtained. Prior Anticoagulants: The patient has                            taken no previous anticoagulant or antiplatelet                            agents. ASA Grade Assessment: II - A patient with  mild systemic disease. After reviewing the risks                            and benefits, the patient was deemed in                            satisfactory condition to undergo the procedure.                           After obtaining informed consent, the colonoscope                            was passed under direct vision. Throughout the                            procedure, the  patient's blood pressure, pulse, and                            oxygen saturations were monitored continuously. The                            [id number] ([id number]) was introduced through the                            anus and advanced to the the cecum, identified by                            appendiceal orifice and ileocecal valve. The                            colonoscopy was performed without difficulty. The                            patient tolerated the procedure well. The quality                            of the bowel preparation was excellent. The                            ileocecal valve, appendiceal orifice, and rectum                            were photographed. Scope In: 7:43:01 AM Scope Out: 7:59:55 AM Scope Withdrawal Time: 0 hours 8 minutes 21 seconds  Total Procedure Duration: 0 hours 16 minutes 54 seconds  Findings:      The perianal and digital rectal examinations were normal.      Multiple medium-mouthed diverticula were found in the sigmoid colon.      The exam was otherwise normal throughout the examined colon.      External hemorrhoids were found. The hemorrhoids were small.      Anal papilla(e) were hypertrophied.      There were three medium-sized lipoma, in the ascending colon. Impression:               - Three submucosal lipomas ascending clon.                           -  Diverticulosis in the sigmoid colon.                           - External hemorrhoids.                           - Anal papilla(e) were hypertrophied.                           - No specimens collected. Moderate Sedation:      Moderate (conscious) sedation was administered by the endoscopy nurse       and supervised by the endoscopist. The following parameters were       monitored: oxygen saturation, heart rate, blood pressure, CO2       capnography and response to care. Total physician intraservice time was       22 minutes. Recommendation:           - Patient has a contact number  available for                            emergencies. The signs and symptoms of potential                            delayed complications were discussed with the                            patient. Return to normal activities tomorrow.                            Written discharge instructions were provided to the                            patient.                           - High fiber diet today.                           - Continue present medications.                           - Repeat colonoscopy in 7 years for surveillance. Procedure Code(s):        --- Professional ---                           [id number], Colonoscopy, flexible; diagnostic, including                            collection of specimen(s) by brushing or washing,                            when performed (separate procedure)                           99152, Moderate sedation services provided by the  same physician or other qualified health care                            professional performing the diagnostic or                            therapeutic service that the sedation supports,                            requiring the presence of an independent trained                            observer to assist in the monitoring of the                            patient's level of consciousness and physiological                            status; initial 15 minutes of intraservice time,                            patient age 60 years or older Diagnosis Code(s):        --- Professional ---                           Z86.010, Personal history of colonic polyps                           K64.4, Residual hemorrhoidal skin tags                           K62.89, Other specified diseases of anus and rectum                           K57.30, Diverticulosis of large intestine without                            perforation or abscess without bleeding CPT copyright 2016 [employer]. All rights  reserved. The codes documented in this report are preliminary and upon coder review may  be revised to meet current compliance requirements. Lionel December, MD Lionel December, MD 12/13/2016 8:08:14 AM This report has been signed electronically. Number of Addenda: 0

## 2016-12-13 NOTE — Discharge Instructions (Signed)
Resume usual medications and high fiber diet. No driving for 24 hours. Next colonoscopy in 7 years..   Colonoscopy, Adult, Care After This sheet gives you information about how to care for yourself after your procedure. Your health care provider may also give you more specific instructions. If you have problems or questions, contact your health care provider. What can I expect after the procedure? After the procedure, it is common to have:  A small amount of blood in your stool for 24 hours after the procedure.  Some gas.  Mild abdominal cramping or bloating. Follow these instructions at home: General instructions    For the first 24 hours after the procedure:  Do not drive or use machinery.  Do not sign important documents.  Do not drink alcohol.  Do your regular daily activities at a slower pace than normal.  Eat soft, easy-to-digest foods.  Rest often.  Take over-the-counter or prescription medicines only as told by your health care provider.  It is up to you to get the results of your procedure. Ask your health care provider, or the department performing the procedure, when your results will be ready. Relieving cramping and bloating   Try walking around when you have cramps or feel bloated.  Apply heat to your abdomen as told by your health care provider. Use a heat source that your health care provider recommends, such as a moist heat pack or a heating pad.  Place a towel between your skin and the heat source.  Leave the heat on for 20-30 minutes.  Remove the heat if your skin turns bright red. This is especially important if you are unable to feel pain, heat, or cold. You may have a greater risk of getting burned. Eating and drinking   Drink enough fluid to keep your urine clear or pale yellow.  Resume your normal diet as instructed by your health care provider. Avoid heavy or fried foods that are hard to digest.  Avoid drinking alcohol for as long as  instructed by your health care provider. Contact a health care provider if:  You have blood in your stool 2-3 days after the procedure. Get help right away if:  You have more than a small spotting of blood in your stool.  You pass large blood clots in your stool.  Your abdomen is swollen.  You have nausea or vomiting.  You have a fever.  You have increasing abdominal pain that is not relieved with medicine. This information is not intended to replace advice given to you by your health care provider. Make sure you discuss any questions you have with your health care provider.   Diverticulosis Diverticulosis is a condition that develops when small pouches (diverticula) form in the wall of the large intestine (colon). The colon is where water is absorbed and stool is formed. The pouches form when the inside layer of the colon pushes through weak spots in the outer layers of the colon. You may have a few pouches or many of them. What are the causes? The cause of this condition is not known. What increases the risk? The following factors may make you more likely to develop this condition:  Being older than age 59. Your risk for this condition increases with age. Diverticulosis is rare among people younger than age 33. By age 49, many people have it.  Eating a low-fiber diet.  Having frequent constipation.  Being overweight.  Not getting enough exercise.  Smoking.  Taking over-the-counter pain medicines,  like aspirin and ibuprofen.  Having a family history of diverticulosis. What are the signs or symptoms? In most people, there are no symptoms of this condition. If you do have symptoms, they may include:  Bloating.  Cramps in the abdomen.  Constipation or diarrhea.  Pain in the lower left side of the abdomen. How is this diagnosed? This condition is most often diagnosed during an exam for other colon problems. Because diverticulosis usually has no symptoms, it often  cannot be diagnosed independently. This condition may be diagnosed by:  Using a flexible scope to examine the colon (colonoscopy).  Taking an X-ray of the colon after dye has been put into the colon (barium enema).  Doing a CT scan. How is this treated? You may not need treatment for this condition if you have never developed an infection related to diverticulosis. If you have had an infection before, treatment may include:  Eating a high-fiber diet. This may include eating more fruits, vegetables, and grains.  Taking a fiber supplement.  Taking a live bacteria supplement (probiotic).  Taking medicine to relax your colon.  Taking antibiotic medicines. Follow these instructions at home:  Drink 6-8 glasses of water or more each day to prevent constipation.  Try not to strain when you have a bowel movement.  If you have had an infection before:  Eat more fiber as directed by your health care provider or your diet and nutrition specialist (dietitian).  Take a fiber supplement or probiotic, if your health care provider approves.  Take over-the-counter and prescription medicines only as told by your health care provider.  If you were prescribed an antibiotic, take it as told by your health care provider. Do not stop taking the antibiotic even if you start to feel better.  Keep all follow-up visits as told by your health care provider. This is important. Contact a health care provider if:  You have pain in your abdomen.  You have bloating.  You have cramps.  You have not had a bowel movement in 3 days. Get help right away if:  Your pain gets worse.  Your bloating becomes very bad.  You have a fever or chills, and your symptoms suddenly get worse.  You vomit.  You have bowel movements that are bloody or black.  You have bleeding from your rectum. Summary  Diverticulosis is a condition that develops when small pouches (diverticula) form in the wall of the large  intestine (colon).  You may have a few pouches or many of them.  This condition is most often diagnosed during an exam for other colon problems.  If you have had an infection related to diverticulosis, treatment may include increasing the fiber in your diet, taking supplements, or taking medicines. This information is not intended to replace advice given to you by your health care provider. Make sure you discuss any questions you have with your health care provider.   High-Fiber Diet Fiber, also called dietary fiber, is a type of carbohydrate found in fruits, vegetables, whole grains, and beans. A high-fiber diet can have many health benefits. Your health care provider may recommend a high-fiber diet to help:  Prevent constipation. Fiber can make your bowel movements more regular.  Lower your cholesterol.  Relieve hemorrhoids, uncomplicated diverticulosis, or irritable bowel syndrome.  Prevent overeating as part of a weight-loss plan.  Prevent heart disease, type 2 diabetes, and certain cancers. What is my plan? The recommended daily intake of fiber includes:  38 grams for men  under age 110.  30 grams for men over age 85.  25 grams for women under age 87.  21 grams for women over age 41. You can get the recommended daily intake of dietary fiber by eating a variety of fruits, vegetables, grains, and beans. Your health care provider may also recommend a fiber supplement if it is not possible to get enough fiber through your diet. What do I need to know about a high-fiber diet?  Fiber supplements have not been widely studied for their effectiveness, so it is better to get fiber through food sources.  Always check the fiber content on thenutrition facts label of any prepackaged food. Look for foods that contain at least 5 grams of fiber per serving.  Ask your dietitian if you have questions about specific foods that are related to your condition, especially if those foods are not  listed in the following section.  Increase your daily fiber consumption gradually. Increasing your intake of dietary fiber too quickly may cause bloating, cramping, or gas.  Drink plenty of water. Water helps you to digest fiber. What foods can I eat? Grains  Whole-grain breads. Multigrain cereal. Oats and oatmeal. Brown rice. Barley. Bulgur wheat. Millet. Bran muffins. Popcorn. Rye wafer crackers. Vegetables  Sweet potatoes. Spinach. Kale. Artichokes. Cabbage. Broccoli. Green peas. Carrots. Squash. Fruits  Berries. Pears. Apples. Oranges. Avocados. Prunes and raisins. Dried figs. Meats and Other Protein Sources  Navy, kidney, pinto, and soy beans. Split peas. Lentils. Nuts and seeds. Dairy  Fiber-fortified yogurt. Beverages  Fiber-fortified soy milk. Fiber-fortified orange juice. Other  Fiber bars. The items listed above may not be a complete list of recommended foods or beverages. Contact your dietitian for more options.  What foods are not recommended? Grains  White bread. Pasta made with refined flour. White rice. Vegetables  Fried potatoes. Canned vegetables. Well-cooked vegetables. Fruits  Fruit juice. Cooked, strained fruit. Meats and Other Protein Sources  Fatty cuts of meat. Fried Environmental education officer or fried fish. Dairy  Milk. Yogurt. Cream cheese. Sour cream. Beverages  Soft drinks. Other  Cakes and pastries. Butter and oils. The items listed above may not be a complete list of foods and beverages to avoid. Contact your dietitian for more information.  What are some tips for including high-fiber foods in my diet?  Eat a wide variety of high-fiber foods.  Make sure that half of all grains consumed each day are whole grains.  Replace breads and cereals made from refined flour or white flour with whole-grain breads and cereals.  Replace white rice with brown rice, bulgur wheat, or millet.  Start the day with a breakfast that is high in fiber, such as a cereal that contains  at least 5 grams of fiber per serving.  Use beans in place of meat in soups, salads, or pasta.  Eat high-fiber snacks, such as berries, raw vegetables, nuts, or popcorn. This information is not intended to replace advice given to you by your health care provider. Make sure you discuss any questions you have with your health care provider.

## 2016-12-13 NOTE — H&P (Signed)
Alison House is an 71 y.o. female.   Chief Complaint: Patient is here for colonoscopy. HPI: Patient is 71 year old Caucasian female with history of colonic polyps. Last colonoscopy was 2010 no polyps are identified. She was here for advice to return in 7 years. She denies abdominal pain change in bowel habits or rectal bleeding. Family history is negative for CRC.  Past Medical History:  Diagnosis Date  . Anxiety   . Asthma    As a child    Past Surgical History:  Procedure Laterality Date  . KNEE ARTHROSCOPY      Family History  Problem Relation Age of Onset  . Dementia Mother   . Diabetes Father   . Heart Problems Father   . Aneurysm Brother    Social History:  reports that she has never smoked. She has never used smokeless tobacco. She reports that she does not drink alcohol or use drugs.  Allergies:  Allergies  Allergen Reactions  . Sulfur Nausea Only    Medications Prior to Admission  Medication Sig Dispense Refill  . Cholecalciferol (VITAMIN D3) 2000 units capsule Take 2,000 Units by mouth daily.    . Glucosamine HCl-MSM (GLUCOSAMINE-MSM PO) Take 1 tablet by mouth daily.    Marland Kitchen. LORazepam (ATIVAN) 0.5 MG tablet Take 0.5 mg by mouth at bedtime as needed for anxiety or sleep.    . Magnesium 250 MG TABS Take 250 mg by mouth at bedtime.    . phenylephrine (SUDAFED PE) 10 MG TABS tablet Take 10 mg by mouth daily as needed (allergies).    . traZODone (DESYREL) 100 MG tablet Take 100 mg by mouth at bedtime.    . vitamin B-12 (CYANOCOBALAMIN) 1000 MCG tablet Take 1,000 mcg by mouth daily.    . vitamin C (ASCORBIC ACID) 500 MG tablet Take 500 mg by mouth 2 (two) times daily.    Marland Kitchen. ibuprofen (ADVIL,MOTRIN) 200 MG tablet Take 200-600 mg by mouth daily as needed for headache or moderate pain.      No results found for this or any previous visit (from the past 48 hour(s)). No results found.  ROS  Blood pressure (!) 132/57, pulse 69, temperature 97.9 F (36.6 C), temperature  source Oral, resp. rate 16, height 5\' 4"  (1.626 m), weight 202 lb (91.6 kg), SpO2 97 %. Physical Exam  Constitutional: She appears well-developed and well-nourished.  HENT:  Mouth/Throat: Oropharynx is clear and moist.  Eyes: Conjunctivae are normal. No scleral icterus.  Neck: No thyromegaly present.  Cardiovascular: Normal rate, regular rhythm and normal heart sounds.   No murmur heard. Respiratory: Effort normal and breath sounds normal.  Right depression of manubrium sternii  GI: Soft. She exhibits no distension and no mass. There is no tenderness.  Musculoskeletal: She exhibits no edema.  Lymphadenopathy:    She has no cervical adenopathy.  Neurological: She is alert.  Skin: Skin is warm and dry.     Assessment/Plan History of colonic adenomas. Surveillance colonoscopy.  Lionel DecemberNajeeb Chaseton Yepiz, MD 12/13/2016, 7:33 AM

## 2016-12-14 ENCOUNTER — Encounter (HOSPITAL_COMMUNITY): Payer: Self-pay | Admitting: Internal Medicine

## 2017-02-11 ENCOUNTER — Other Ambulatory Visit (HOSPITAL_COMMUNITY): Payer: Self-pay | Admitting: Internal Medicine

## 2017-02-11 DIAGNOSIS — Z1231 Encounter for screening mammogram for malignant neoplasm of breast: Secondary | ICD-10-CM

## 2017-02-13 DIAGNOSIS — Z6834 Body mass index (BMI) 34.0-34.9, adult: Secondary | ICD-10-CM | POA: Diagnosis not present

## 2017-02-13 DIAGNOSIS — M546 Pain in thoracic spine: Secondary | ICD-10-CM | POA: Diagnosis not present

## 2017-02-21 ENCOUNTER — Ambulatory Visit (HOSPITAL_COMMUNITY)
Admission: RE | Admit: 2017-02-21 | Discharge: 2017-02-21 | Disposition: A | Discharge status: Home / Self Care | Payer: Medicare HMO | Source: Ambulatory Visit | Attending: Internal Medicine | Admitting: Internal Medicine

## 2017-02-21 DIAGNOSIS — Z1231 Encounter for screening mammogram for malignant neoplasm of breast: Secondary | ICD-10-CM

## 2017-04-01 DIAGNOSIS — Z0100 Encounter for examination of eyes and vision without abnormal findings: Secondary | ICD-10-CM | POA: Diagnosis not present

## 2017-08-19 DIAGNOSIS — Z79899 Other long term (current) drug therapy: Secondary | ICD-10-CM | POA: Diagnosis not present

## 2017-08-19 DIAGNOSIS — M1990 Unspecified osteoarthritis, unspecified site: Secondary | ICD-10-CM | POA: Diagnosis not present

## 2017-08-19 DIAGNOSIS — G4700 Insomnia, unspecified: Secondary | ICD-10-CM | POA: Diagnosis not present

## 2017-08-19 DIAGNOSIS — F419 Anxiety disorder, unspecified: Secondary | ICD-10-CM | POA: Diagnosis not present

## 2017-08-29 DIAGNOSIS — Z0001 Encounter for general adult medical examination with abnormal findings: Secondary | ICD-10-CM | POA: Diagnosis not present

## 2017-08-29 DIAGNOSIS — Z23 Encounter for immunization: Secondary | ICD-10-CM | POA: Diagnosis not present

## 2017-08-29 DIAGNOSIS — M1990 Unspecified osteoarthritis, unspecified site: Secondary | ICD-10-CM | POA: Diagnosis not present

## 2017-08-29 DIAGNOSIS — Z6834 Body mass index (BMI) 34.0-34.9, adult: Secondary | ICD-10-CM | POA: Diagnosis not present

## 2017-08-29 DIAGNOSIS — G4700 Insomnia, unspecified: Secondary | ICD-10-CM | POA: Diagnosis not present

## 2018-01-23 ENCOUNTER — Other Ambulatory Visit (HOSPITAL_COMMUNITY): Payer: Self-pay | Admitting: Internal Medicine

## 2018-01-23 DIAGNOSIS — Z1231 Encounter for screening mammogram for malignant neoplasm of breast: Secondary | ICD-10-CM

## 2018-02-28 ENCOUNTER — Encounter (HOSPITAL_COMMUNITY): Payer: Self-pay

## 2018-02-28 ENCOUNTER — Ambulatory Visit (HOSPITAL_COMMUNITY)
Admission: RE | Admit: 2018-02-28 | Discharge: 2018-02-28 | Disposition: A | Discharge status: Home / Self Care | Payer: Medicare HMO | Source: Ambulatory Visit | Attending: Internal Medicine | Admitting: Internal Medicine

## 2018-02-28 DIAGNOSIS — Z1231 Encounter for screening mammogram for malignant neoplasm of breast: Secondary | ICD-10-CM | POA: Diagnosis not present

## 2018-05-30 DIAGNOSIS — L209 Atopic dermatitis, unspecified: Secondary | ICD-10-CM | POA: Diagnosis not present

## 2018-05-30 DIAGNOSIS — Z6835 Body mass index (BMI) 35.0-35.9, adult: Secondary | ICD-10-CM | POA: Diagnosis not present

## 2018-07-11 DIAGNOSIS — Z23 Encounter for immunization: Secondary | ICD-10-CM | POA: Diagnosis not present

## 2018-09-19 DIAGNOSIS — F419 Anxiety disorder, unspecified: Secondary | ICD-10-CM | POA: Diagnosis not present

## 2018-09-19 DIAGNOSIS — M1990 Unspecified osteoarthritis, unspecified site: Secondary | ICD-10-CM | POA: Diagnosis not present

## 2018-09-19 DIAGNOSIS — G4700 Insomnia, unspecified: Secondary | ICD-10-CM | POA: Diagnosis not present

## 2018-09-19 DIAGNOSIS — Z79899 Other long term (current) drug therapy: Secondary | ICD-10-CM | POA: Diagnosis not present

## 2018-09-26 DIAGNOSIS — Z0001 Encounter for general adult medical examination with abnormal findings: Secondary | ICD-10-CM | POA: Diagnosis not present

## 2018-09-26 DIAGNOSIS — M1991 Primary osteoarthritis, unspecified site: Secondary | ICD-10-CM | POA: Diagnosis not present

## 2018-09-26 DIAGNOSIS — R3129 Other microscopic hematuria: Secondary | ICD-10-CM | POA: Diagnosis not present

## 2018-09-26 DIAGNOSIS — F419 Anxiety disorder, unspecified: Secondary | ICD-10-CM | POA: Diagnosis not present

## 2018-09-26 DIAGNOSIS — Z6834 Body mass index (BMI) 34.0-34.9, adult: Secondary | ICD-10-CM | POA: Diagnosis not present

## 2018-10-30 DIAGNOSIS — R55 Syncope and collapse: Secondary | ICD-10-CM | POA: Diagnosis not present

## 2018-10-30 DIAGNOSIS — R110 Nausea: Secondary | ICD-10-CM | POA: Diagnosis not present

## 2018-10-30 DIAGNOSIS — R42 Dizziness and giddiness: Secondary | ICD-10-CM | POA: Diagnosis not present

## 2018-10-30 DIAGNOSIS — R030 Elevated blood-pressure reading, without diagnosis of hypertension: Secondary | ICD-10-CM | POA: Diagnosis not present

## 2018-10-30 DIAGNOSIS — R9431 Abnormal electrocardiogram [ECG] [EKG]: Secondary | ICD-10-CM | POA: Diagnosis not present

## 2018-10-30 DIAGNOSIS — R319 Hematuria, unspecified: Secondary | ICD-10-CM | POA: Diagnosis not present

## 2018-10-31 DIAGNOSIS — R9431 Abnormal electrocardiogram [ECG] [EKG]: Secondary | ICD-10-CM | POA: Diagnosis not present

## 2018-11-03 DIAGNOSIS — R030 Elevated blood-pressure reading, without diagnosis of hypertension: Secondary | ICD-10-CM | POA: Diagnosis not present

## 2018-12-08 DIAGNOSIS — R05 Cough: Secondary | ICD-10-CM | POA: Diagnosis not present

## 2019-02-07 ENCOUNTER — Encounter (HOSPITAL_COMMUNITY): Payer: Self-pay | Admitting: Emergency Medicine

## 2019-02-07 ENCOUNTER — Emergency Department (HOSPITAL_COMMUNITY)
Admission: EM | Admit: 2019-02-07 | Discharge: 2019-02-07 | Disposition: A | Discharge status: Home / Self Care | Payer: Medicare HMO | Attending: Emergency Medicine | Admitting: Emergency Medicine

## 2019-02-07 ENCOUNTER — Emergency Department (HOSPITAL_COMMUNITY): Payer: Medicare HMO

## 2019-02-07 ENCOUNTER — Other Ambulatory Visit: Payer: Self-pay

## 2019-02-07 DIAGNOSIS — S0993XA Unspecified injury of face, initial encounter: Secondary | ICD-10-CM | POA: Diagnosis not present

## 2019-02-07 DIAGNOSIS — Y9259 Other trade areas as the place of occurrence of the external cause: Secondary | ICD-10-CM | POA: Insufficient documentation

## 2019-02-07 DIAGNOSIS — R69 Illness, unspecified: Secondary | ICD-10-CM | POA: Diagnosis not present

## 2019-02-07 DIAGNOSIS — Y9301 Activity, walking, marching and hiking: Secondary | ICD-10-CM | POA: Diagnosis not present

## 2019-02-07 DIAGNOSIS — S3991XA Unspecified injury of abdomen, initial encounter: Secondary | ICD-10-CM | POA: Diagnosis not present

## 2019-02-07 DIAGNOSIS — J45909 Unspecified asthma, uncomplicated: Secondary | ICD-10-CM | POA: Insufficient documentation

## 2019-02-07 DIAGNOSIS — R51 Headache: Secondary | ICD-10-CM | POA: Diagnosis not present

## 2019-02-07 DIAGNOSIS — Z79899 Other long term (current) drug therapy: Secondary | ICD-10-CM | POA: Diagnosis not present

## 2019-02-07 DIAGNOSIS — S060X0A Concussion without loss of consciousness, initial encounter: Secondary | ICD-10-CM

## 2019-02-07 DIAGNOSIS — Y998 Other external cause status: Secondary | ICD-10-CM | POA: Diagnosis not present

## 2019-02-07 DIAGNOSIS — S0990XA Unspecified injury of head, initial encounter: Secondary | ICD-10-CM | POA: Diagnosis not present

## 2019-02-07 DIAGNOSIS — S199XXA Unspecified injury of neck, initial encounter: Secondary | ICD-10-CM | POA: Diagnosis not present

## 2019-02-07 DIAGNOSIS — S299XXA Unspecified injury of thorax, initial encounter: Secondary | ICD-10-CM | POA: Diagnosis not present

## 2019-02-07 DIAGNOSIS — R5381 Other malaise: Secondary | ICD-10-CM | POA: Diagnosis not present

## 2019-02-07 LAB — CBC
HCT: 41.2 % (ref 36.0–46.0)
Hemoglobin: 13.8 g/dL (ref 12.0–15.0)
MCH: 31.8 pg (ref 26.0–34.0)
MCHC: 33.5 g/dL (ref 30.0–36.0)
MCV: 94.9 fL (ref 80.0–100.0)
Platelets: 207 10*3/uL (ref 150–400)
RBC: 4.34 MIL/uL (ref 3.87–5.11)
RDW: 11.9 % (ref 11.5–15.5)
WBC: 8.8 10*3/uL (ref 4.0–10.5)
nRBC: 0 % (ref 0.0–0.2)

## 2019-02-07 LAB — BASIC METABOLIC PANEL
Anion gap: 10 (ref 5–15)
BUN: 14 mg/dL (ref 8–23)
CO2: 24 mmol/L (ref 22–32)
Calcium: 8.8 mg/dL — ABNORMAL LOW (ref 8.9–10.3)
Chloride: 104 mmol/L (ref 98–111)
Creatinine, Ser: 0.78 mg/dL (ref 0.44–1.00)
GFR calc Af Amer: >60 mL/min (ref >60)
GFR calc non Af Amer: >60 mL/min (ref >60)
Glucose, Bld: 125 mg/dL — ABNORMAL HIGH (ref 70–99)
Potassium: 3.4 mmol/L — ABNORMAL LOW (ref 3.5–5.1)
Sodium: 138 mmol/L (ref 135–145)

## 2019-02-07 MED ORDER — HYDROMORPHONE HCL 1 MG/ML IJ SOLN: 0.5000 mg | Freq: Once | INTRAMUSCULAR | Status: DC

## 2019-02-07 MED ORDER — SODIUM CHLORIDE 0.9 % IV SOLN
INTRAVENOUS | Status: DC
  Administered 2019-02-07: 13:00:00 via INTRAVENOUS

## 2019-02-07 MED ORDER — ONDANSETRON HCL 4 MG/2ML IJ SOLN
4.0000 mg | Freq: Once | INTRAMUSCULAR | Status: AC
  Administered 2019-02-07: 13:00:00 4 mg via INTRAVENOUS
  Filled 2019-02-07: qty 2

## 2019-02-07 MED ORDER — PROMETHAZINE HCL 25 MG/ML IJ SOLN
12.5000 mg | Freq: Once | INTRAMUSCULAR | Status: AC
  Administered 2019-02-07: 12.5 mg via INTRAVENOUS

## 2019-02-07 MED ORDER — PROMETHAZINE HCL 25 MG/ML IJ SOLN
INTRAMUSCULAR | Status: AC
  Filled 2019-02-07: qty 1

## 2019-02-07 MED ORDER — HYDROCODONE-ACETAMINOPHEN 5-325 MG PO TABS: 1.0000 | ORAL_TABLET | Freq: Four times a day (QID) | ORAL | 0 refills | Status: DC | PRN

## 2019-02-07 MED ORDER — ONDANSETRON 4 MG PO TBDP: 4.0000 mg | ORAL_TABLET | Freq: Three times a day (TID) | ORAL | 1 refills | Status: DC | PRN

## 2019-02-07 MED ORDER — ONDANSETRON HCL 4 MG/2ML IJ SOLN
4.0000 mg | Freq: Once | INTRAMUSCULAR | Status: AC
  Administered 2019-02-07: 15:00:00 4 mg via INTRAVENOUS
  Filled 2019-02-07: qty 2

## 2019-02-07 MED ORDER — IOHEXOL 300 MG/ML  SOLN
100.0000 mL | Freq: Once | INTRAMUSCULAR | Status: AC | PRN
  Administered 2019-02-07: 14:00:00 100 mL via INTRAVENOUS

## 2019-02-07 MED ORDER — ONDANSETRON 4 MG PO TBDP
4.0000 mg | ORAL_TABLET | Freq: Once | ORAL | Status: DC
  Filled 2019-02-07: qty 1
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
## 2019-02-07 NOTE — ED Notes (Signed)
Patient transported to CT 
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
## 2019-02-07 NOTE — ED Notes (Signed)
Patient and daughter verbalized understanding of discharge papers. Signature pad not working in room, unable to get signature.

## 2019-02-07 NOTE — ED Notes (Signed)
Pt became very nauseated with vomiting in bathroom. Medicated as ordered. Pt did not want pain medication at this time due to nausea.

## 2019-02-07 NOTE — ED Provider Notes (Signed)
Pt signed out by Dr. Deretha Emory pending results of CT face and pt's symptomatic improvement.  CT face ok.  Pt's nausea is finally better after phenergan.  Pt knows to return if worse.  F/u with pcp.  Return if worse.   Jacalyn Lefevre, MD 02/07/19 [phone]

## 2019-02-09 DIAGNOSIS — S060X0A Concussion without loss of consciousness, initial encounter: Secondary | ICD-10-CM | POA: Diagnosis not present

## 2019-02-09 DIAGNOSIS — S27321A Contusion of lung, unilateral, initial encounter: Secondary | ICD-10-CM | POA: Diagnosis not present

## 2019-02-09 DIAGNOSIS — S0003XA Contusion of scalp, initial encounter: Secondary | ICD-10-CM | POA: Diagnosis not present

## 2019-02-13 ENCOUNTER — Other Ambulatory Visit: Payer: Self-pay

## 2019-02-13 ENCOUNTER — Encounter (HOSPITAL_COMMUNITY): Payer: Self-pay | Admitting: Emergency Medicine

## 2019-02-13 ENCOUNTER — Emergency Department (HOSPITAL_COMMUNITY): Payer: Medicare HMO

## 2019-02-13 ENCOUNTER — Emergency Department (HOSPITAL_COMMUNITY)
Admission: EM | Admit: 2019-02-13 | Discharge: 2019-02-13 | Disposition: A | Discharge status: Home / Self Care | Payer: Medicare HMO | Attending: Emergency Medicine | Admitting: Emergency Medicine

## 2019-02-13 DIAGNOSIS — Y9389 Activity, other specified: Secondary | ICD-10-CM | POA: Diagnosis not present

## 2019-02-13 DIAGNOSIS — Y998 Other external cause status: Secondary | ICD-10-CM | POA: Insufficient documentation

## 2019-02-13 DIAGNOSIS — Y9289 Other specified places as the place of occurrence of the external cause: Secondary | ICD-10-CM | POA: Diagnosis not present

## 2019-02-13 DIAGNOSIS — Z79899 Other long term (current) drug therapy: Secondary | ICD-10-CM | POA: Diagnosis not present

## 2019-02-13 DIAGNOSIS — F419 Anxiety disorder, unspecified: Secondary | ICD-10-CM | POA: Diagnosis not present

## 2019-02-13 DIAGNOSIS — R29810 Facial weakness: Secondary | ICD-10-CM | POA: Diagnosis present

## 2019-02-13 DIAGNOSIS — R202 Paresthesia of skin: Secondary | ICD-10-CM | POA: Diagnosis not present

## 2019-02-13 DIAGNOSIS — I700 Atherosclerosis of aorta: Secondary | ICD-10-CM | POA: Diagnosis not present

## 2019-02-13 DIAGNOSIS — W19XXXA Unspecified fall, initial encounter: Secondary | ICD-10-CM | POA: Insufficient documentation

## 2019-02-13 DIAGNOSIS — S199XXD Unspecified injury of neck, subsequent encounter: Secondary | ICD-10-CM | POA: Diagnosis not present

## 2019-02-13 DIAGNOSIS — S02119A Unspecified fracture of occiput, initial encounter for closed fracture: Secondary | ICD-10-CM

## 2019-02-13 LAB — DIFFERENTIAL
Abs Immature Granulocytes: 0.01 10*3/uL (ref 0.00–0.07)
Basophils Absolute: 0 10*3/uL (ref 0.0–0.1)
Basophils Relative: 0 %
Eosinophils Absolute: 0.1 10*3/uL (ref 0.0–0.5)
Eosinophils Relative: 2 %
Immature Granulocytes: 0 %
Lymphocytes Relative: 18 %
Lymphs Abs: 1.2 10*3/uL (ref 0.7–4.0)
Monocytes Absolute: 0.4 10*3/uL (ref 0.1–1.0)
Monocytes Relative: 6 %
Neutro Abs: 4.8 10*3/uL (ref 1.7–7.7)
Neutrophils Relative %: 74 %

## 2019-02-13 LAB — URINALYSIS, ROUTINE W REFLEX MICROSCOPIC
Bacteria, UA: NONE SEEN
Bilirubin Urine: NEGATIVE
Glucose, UA: NEGATIVE mg/dL
Ketones, ur: NEGATIVE mg/dL
Leukocytes,Ua: NEGATIVE
Nitrite: NEGATIVE
Protein, ur: NEGATIVE mg/dL
Specific Gravity, Urine: 1.014 (ref 1.005–1.030)
pH: 6 (ref 5.0–8.0)

## 2019-02-13 LAB — CBC
HCT: 40.2 % (ref 36.0–46.0)
Hemoglobin: 13.6 g/dL (ref 12.0–15.0)
MCH: 32.2 pg (ref 26.0–34.0)
MCHC: 33.8 g/dL (ref 30.0–36.0)
MCV: 95.3 fL (ref 80.0–100.0)
Platelets: 205 10*3/uL (ref 150–400)
RBC: 4.22 MIL/uL (ref 3.87–5.11)
RDW: 12.1 % (ref 11.5–15.5)
WBC: 6.5 10*3/uL (ref 4.0–10.5)
nRBC: 0 % (ref 0.0–0.2)

## 2019-02-13 LAB — RAPID URINE DRUG SCREEN, HOSP PERFORMED
Amphetamines: NOT DETECTED
Barbiturates: NOT DETECTED
Benzodiazepines: NOT DETECTED
Cocaine: NOT DETECTED
Opiates: NOT DETECTED
Tetrahydrocannabinol: NOT DETECTED

## 2019-02-13 LAB — COMPREHENSIVE METABOLIC PANEL
ALT: 14 U/L (ref 0–44)
AST: 22 U/L (ref 15–41)
Albumin: 3.6 g/dL (ref 3.5–5.0)
Alkaline Phosphatase: 69 U/L (ref 38–126)
Anion gap: 8 (ref 5–15)
BUN: 15 mg/dL (ref 8–23)
CO2: 26 mmol/L (ref 22–32)
Calcium: 8.7 mg/dL — ABNORMAL LOW (ref 8.9–10.3)
Chloride: 106 mmol/L (ref 98–111)
Creatinine, Ser: 0.81 mg/dL (ref 0.44–1.00)
GFR calc Af Amer: >60 mL/min (ref >60)
GFR calc non Af Amer: >60 mL/min (ref >60)
Glucose, Bld: 138 mg/dL — ABNORMAL HIGH (ref 70–99)
Potassium: 3.5 mmol/L (ref 3.5–5.1)
Sodium: 140 mmol/L (ref 135–145)
Total Bilirubin: 0.6 mg/dL (ref 0.3–1.2)
Total Protein: 6.6 g/dL (ref 6.5–8.1)

## 2019-02-13 LAB — PROTIME-INR
INR: 0.9 (ref 0.8–1.2)
Prothrombin Time: 12.5 seconds (ref 11.4–15.2)

## 2019-02-13 LAB — APTT: aPTT: 29 seconds (ref 24–36)

## 2019-02-13 LAB — ETHANOL: Alcohol, Ethyl (B): <10 mg/dL (ref <10)

## 2019-02-13 LAB — TROPONIN I: Troponin I: <0.03 ng/mL (ref <0.03)
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
## 2019-02-13 NOTE — ED Notes (Signed)
ED Provider at bedside Tele Neuro.

## 2019-02-13 NOTE — Discharge Instructions (Addendum)
Your CT scan(s) showed incidental findings:  "1) Nondepressed left occipital skull fracture extending to the foramen magnum.; 2) Patchy ground-glass density in the right middle lobe could reflect a mild pulmonary contusion. There are also some indistinct ground-glass density nodules in the left upper lobe and right upper lobe.  Non-contrast chest CT at 3-6 months is recommended. If nodules persist, subsequent management will be based upon the most suspicious nodule(s). This recommendation follows the consensus statement: Guidelines for Management of Incidental Pulmonary Nodules Detected on CT Images: From the Fleischner Society 2017; Radiology 2017; 84:228-243.; 3) Faint central mesenteric stranding."  Your regular medical doctor can follow up these findings. Take aspirin 81mg  by mouth daily until you are seen in follow up by your regular medical doctor.  Call your regular medical doctor today to schedule a follow up appointment within the next week.  Return to the Emergency Department immediately sooner if worsening.
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
## 2019-02-19 DIAGNOSIS — S02113A Unspecified occipital condyle fracture, initial encounter for closed fracture: Secondary | ICD-10-CM | POA: Diagnosis not present

## 2019-02-19 DIAGNOSIS — M545 Low back pain: Secondary | ICD-10-CM | POA: Diagnosis not present

## 2019-03-04 DIAGNOSIS — M545 Low back pain: Secondary | ICD-10-CM | POA: Diagnosis not present

## 2019-03-04 DIAGNOSIS — S060X0A Concussion without loss of consciousness, initial encounter: Secondary | ICD-10-CM | POA: Diagnosis not present

## 2019-03-27 DIAGNOSIS — S02113D Unspecified occipital condyle fracture, subsequent encounter for fracture with routine healing: Secondary | ICD-10-CM | POA: Diagnosis not present

## 2019-03-27 DIAGNOSIS — S060X0D Concussion without loss of consciousness, subsequent encounter: Secondary | ICD-10-CM | POA: Diagnosis not present

## 2019-04-07 ENCOUNTER — Other Ambulatory Visit (HOSPITAL_COMMUNITY): Payer: Self-pay | Admitting: Internal Medicine

## 2019-04-07 DIAGNOSIS — Z1231 Encounter for screening mammogram for malignant neoplasm of breast: Secondary | ICD-10-CM

## 2019-04-13 ENCOUNTER — Ambulatory Visit: Payer: Medicare HMO | Admitting: Neurology

## 2019-04-13 ENCOUNTER — Other Ambulatory Visit: Payer: Self-pay

## 2019-04-13 ENCOUNTER — Encounter: Payer: Self-pay | Admitting: Neurology

## 2019-04-13 DIAGNOSIS — R430 Anosmia: Secondary | ICD-10-CM

## 2019-04-13 DIAGNOSIS — G4486 Cervicogenic headache: Secondary | ICD-10-CM

## 2019-04-13 DIAGNOSIS — S161XXA Strain of muscle, fascia and tendon at neck level, initial encounter: Secondary | ICD-10-CM | POA: Diagnosis not present

## 2019-04-13 DIAGNOSIS — R55 Syncope and collapse: Secondary | ICD-10-CM | POA: Diagnosis not present

## 2019-04-13 DIAGNOSIS — F0781 Postconcussional syndrome: Secondary | ICD-10-CM

## 2019-04-13 DIAGNOSIS — R51 Headache: Secondary | ICD-10-CM | POA: Diagnosis not present

## 2019-04-13 HISTORY — DX: Cervicogenic headache: G44.86

## 2019-04-13 HISTORY — DX: Postconcussional syndrome: F07.81

## 2019-04-13 HISTORY — DX: Anosmia: R43.0

## 2019-04-13 HISTORY — DX: Strain of muscle, fascia and tendon at neck level, initial encounter: S16.1XXA

## 2019-04-13 MED ORDER — CYCLOBENZAPRINE HCL 5 MG PO TABS: ORAL_TABLET | ORAL | 3 refills | Status: DC

## 2019-04-13 MED ORDER — MELOXICAM 15 MG PO TABS: 15.0000 mg | ORAL_TABLET | Freq: Every day | ORAL | 2 refills | Status: DC
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
## 2019-04-16 ENCOUNTER — Other Ambulatory Visit (HOSPITAL_COMMUNITY): Payer: Self-pay | Admitting: Internal Medicine

## 2019-04-16 ENCOUNTER — Other Ambulatory Visit: Payer: Self-pay | Admitting: Internal Medicine

## 2019-04-16 DIAGNOSIS — R519 Headache, unspecified: Secondary | ICD-10-CM

## 2019-04-22 ENCOUNTER — Ambulatory Visit (HOSPITAL_COMMUNITY): Payer: Medicare HMO

## 2019-04-23 ENCOUNTER — Other Ambulatory Visit: Payer: Self-pay

## 2019-04-23 ENCOUNTER — Ambulatory Visit (HOSPITAL_COMMUNITY)
Admission: RE | Admit: 2019-04-23 | Discharge: 2019-04-23 | Disposition: A | Discharge status: Home / Self Care | Payer: Medicare HMO | Source: Ambulatory Visit | Attending: Internal Medicine | Admitting: Internal Medicine

## 2019-04-23 DIAGNOSIS — R51 Headache: Secondary | ICD-10-CM | POA: Diagnosis not present

## 2019-04-23 DIAGNOSIS — R519 Headache, unspecified: Secondary | ICD-10-CM

## 2019-04-23 DIAGNOSIS — R42 Dizziness and giddiness: Secondary | ICD-10-CM | POA: Diagnosis not present

## 2019-04-24 DIAGNOSIS — H5200 Hypermetropia, unspecified eye: Secondary | ICD-10-CM | POA: Diagnosis not present

## 2019-04-24 DIAGNOSIS — Z0100 Encounter for examination of eyes and vision without abnormal findings: Secondary | ICD-10-CM | POA: Diagnosis not present

## 2019-04-27 ENCOUNTER — Other Ambulatory Visit: Payer: Self-pay

## 2019-04-27 ENCOUNTER — Other Ambulatory Visit: Payer: Medicare HMO

## 2019-04-27 DIAGNOSIS — F0781 Postconcussional syndrome: Secondary | ICD-10-CM | POA: Diagnosis not present

## 2019-04-27 DIAGNOSIS — Z20822 Contact with and (suspected) exposure to covid-19: Secondary | ICD-10-CM

## 2019-04-27 DIAGNOSIS — R6889 Other general symptoms and signs: Secondary | ICD-10-CM | POA: Diagnosis not present

## 2019-04-27 DIAGNOSIS — M542 Cervicalgia: Secondary | ICD-10-CM | POA: Diagnosis not present

## 2019-04-28 ENCOUNTER — Telehealth (HOSPITAL_COMMUNITY): Payer: Self-pay | Admitting: Physical Therapy

## 2019-04-28 LAB — NOVEL CORONAVIRUS, NAA: SARS-CoV-2, NAA: NOT DETECTED

## 2019-04-28 NOTE — Telephone Encounter (Signed)
Noted that patient had COVID-19 test, reached out to patient to address this. She reports that she did get tested on 04/27/19 and is still waiting for results. PT explained that per clinic policy we must have negative test prior to starting therapy since she did have reason to be tested.   Patient very agreeable to this, voiced understanding and said that she was told that she may get the results in as soon as 3 days.   Due to timing of appointment and her test, will wait to see if results come back prior to appointment. Patient knows to call clinic if she has not heard results of COVID test by time of appointment on 7/30, clinic will also reach out to her if we hear anything about her test.   Will plan to reschedule eval if we do not have results of Covid-19 testing prior to her appointment on 7/30.  Deniece Ree PT, DPT, CBIS  Supplemental Physical Therapist [facility]    Pager [phone] Acute Rehab Office [phone]

## 2019-04-29 ENCOUNTER — Telehealth (HOSPITAL_COMMUNITY): Payer: Self-pay | Admitting: Internal Medicine

## 2019-04-29 NOTE — Telephone Encounter (Signed)
04/29/19  pt called to cx because she still doesn't have her COVID results.

## 2019-04-30 ENCOUNTER — Ambulatory Visit (HOSPITAL_COMMUNITY): Payer: Medicare HMO | Admitting: Physical Therapy

## 2019-04-30 ENCOUNTER — Ambulatory Visit (INDEPENDENT_AMBULATORY_CARE_PROVIDER_SITE_OTHER): Payer: Medicare HMO | Admitting: Otolaryngology

## 2019-05-01 ENCOUNTER — Telehealth (HOSPITAL_COMMUNITY): Payer: Self-pay | Admitting: Internal Medicine

## 2019-05-01 NOTE — Telephone Encounter (Signed)
05/01/19  Pt called to confirm the 8/3 appt.  She said that her COVID test was negative.

## 2019-05-04 ENCOUNTER — Encounter (HOSPITAL_COMMUNITY): Payer: Self-pay | Admitting: Physical Therapy

## 2019-05-04 ENCOUNTER — Other Ambulatory Visit: Payer: Self-pay

## 2019-05-04 ENCOUNTER — Ambulatory Visit (HOSPITAL_COMMUNITY): Payer: Medicare HMO | Attending: Neurology | Admitting: Physical Therapy

## 2019-05-04 DIAGNOSIS — M542 Cervicalgia: Secondary | ICD-10-CM | POA: Diagnosis not present

## 2019-05-04 DIAGNOSIS — M6281 Muscle weakness (generalized): Secondary | ICD-10-CM

## 2019-05-04 DIAGNOSIS — R293 Abnormal posture: Secondary | ICD-10-CM | POA: Diagnosis not present

## 2019-05-04 NOTE — Patient Instructions (Signed)
   UPPER TRAP STRETCH - HOLDING CHAIR  While sitting in a chair, hold the seat with one hand and bend your head towards the opposite side for a gentle stretch to the side of the neck.  Hold for 30 seconds, then relax.  Repeat 3 times each side, twice a day.    LEVATOR SCAPULAE STRETCH  Tilt your head to the side, then rotate to the side, then tip downward as in looking at your opposite pocket.   You should feel a gentle stretch at the side/back of your neck.    Hold for 30 seconds, then relax.  Repeat 3 times each side, twice a day.     SCM Stretch  1. With head in neutral position, place both hands around the front of your throat  2. Drag down slack skin towards your collar bones  3. Slowly tilt head back and look up  4. You should feel a strong stretch at the front of your neck  5. Hold position then return head to neutral position  6. Reset grip on skin and repeat.  Hold for 30 seconds, then relax.  Repeat 3 times, twice a day.     CERVICAL RETRACTION / CHIN TUCK  Slowly draw your head back so that your ears line up with your shoulders.  You should not be looking up- your head and face should stay relatively level.  You should feel a stretch and maybe some discomfort at the base of your skull/upper neck.  Hold for 5 seconds then relax.  Repeat 10 times, 3 times per day.    Suboccipital Release with balls  Take 2 tennis balls (can also try other balls) and tape them together, or alternatively place in a pillow-case or sock.  Laying on your back on a firm surface (does not work as well in bed), place the two tennis balls at the base of your skull right where the bone of your skull meets the muscles. Relax your head over the balls, your head should be in a "chin-tuck" position as opposed to extended backwards. You should be able to relax your head and not have to work at maintaining your head in that position, if the balls feel like they're sliding down  just place the balls slightly higher.   This activity can work wonders for people with classic tension headache issues as well as a variety of other cervical ailments.   Relax in this position for 2-3 minutes; add time as you become more comfortable.
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
## 2019-05-06 ENCOUNTER — Ambulatory Visit (HOSPITAL_COMMUNITY): Payer: Medicare HMO | Admitting: Physical Therapy

## 2019-05-06 ENCOUNTER — Other Ambulatory Visit: Payer: Self-pay

## 2019-05-06 DIAGNOSIS — M6281 Muscle weakness (generalized): Secondary | ICD-10-CM | POA: Diagnosis not present

## 2019-05-06 DIAGNOSIS — M542 Cervicalgia: Secondary | ICD-10-CM

## 2019-05-06 DIAGNOSIS — R293 Abnormal posture: Secondary | ICD-10-CM | POA: Diagnosis not present
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
## 2019-05-07 ENCOUNTER — Telehealth (HOSPITAL_COMMUNITY): Payer: Self-pay | Admitting: Internal Medicine

## 2019-05-07 NOTE — Telephone Encounter (Signed)
05/07/19  I called to talk to patient about her insurance and she said that she couldn't afford to pay $80 a week so she needed to cancel all of the remaining appts

## 2019-05-11 ENCOUNTER — Ambulatory Visit (HOSPITAL_COMMUNITY): Payer: Medicare HMO | Admitting: Physical Therapy

## 2019-05-13 ENCOUNTER — Ambulatory Visit (HOSPITAL_COMMUNITY): Payer: Medicare HMO | Admitting: Physical Therapy

## 2019-05-20 ENCOUNTER — Encounter (HOSPITAL_COMMUNITY): Payer: Medicare HMO

## 2019-05-22 ENCOUNTER — Encounter (HOSPITAL_COMMUNITY): Payer: Medicare HMO

## 2019-05-22 ENCOUNTER — Ambulatory Visit (HOSPITAL_COMMUNITY): Payer: Medicare HMO

## 2019-05-26 ENCOUNTER — Encounter (HOSPITAL_COMMUNITY): Payer: Medicare HMO | Admitting: Physical Therapy

## 2019-05-28 ENCOUNTER — Other Ambulatory Visit: Payer: Self-pay

## 2019-05-28 ENCOUNTER — Ambulatory Visit (HOSPITAL_COMMUNITY)
Admission: RE | Admit: 2019-05-28 | Discharge: 2019-05-28 | Disposition: A | Discharge status: Home / Self Care | Payer: Medicare HMO | Source: Ambulatory Visit | Attending: Internal Medicine | Admitting: Internal Medicine

## 2019-05-28 ENCOUNTER — Encounter (HOSPITAL_COMMUNITY): Payer: Medicare HMO

## 2019-05-28 DIAGNOSIS — Z1231 Encounter for screening mammogram for malignant neoplasm of breast: Secondary | ICD-10-CM | POA: Insufficient documentation

## 2019-06-01 ENCOUNTER — Ambulatory Visit (INDEPENDENT_AMBULATORY_CARE_PROVIDER_SITE_OTHER): Payer: Medicare HMO | Admitting: Otolaryngology

## 2019-06-01 DIAGNOSIS — H903 Sensorineural hearing loss, bilateral: Secondary | ICD-10-CM

## 2019-06-01 DIAGNOSIS — H6123 Impacted cerumen, bilateral: Secondary | ICD-10-CM | POA: Diagnosis not present

## 2019-06-01 DIAGNOSIS — H9313 Tinnitus, bilateral: Secondary | ICD-10-CM | POA: Diagnosis not present

## 2019-06-01 DIAGNOSIS — R430 Anosmia: Secondary | ICD-10-CM | POA: Diagnosis not present

## 2019-06-02 ENCOUNTER — Encounter (HOSPITAL_COMMUNITY): Payer: Medicare HMO

## 2019-06-04 ENCOUNTER — Encounter (HOSPITAL_COMMUNITY): Payer: Medicare HMO

## 2019-06-05 DIAGNOSIS — S27321A Contusion of lung, unilateral, initial encounter: Secondary | ICD-10-CM | POA: Diagnosis not present

## 2019-06-05 DIAGNOSIS — S0990XA Unspecified injury of head, initial encounter: Secondary | ICD-10-CM | POA: Diagnosis not present

## 2019-06-09 ENCOUNTER — Encounter (HOSPITAL_COMMUNITY): Payer: Medicare HMO

## 2019-06-11 ENCOUNTER — Ambulatory Visit (HOSPITAL_COMMUNITY): Payer: Medicare HMO

## 2019-06-23 ENCOUNTER — Other Ambulatory Visit: Payer: Self-pay | Admitting: Internal Medicine

## 2019-06-23 ENCOUNTER — Other Ambulatory Visit (HOSPITAL_COMMUNITY): Payer: Self-pay | Admitting: Internal Medicine

## 2019-06-23 DIAGNOSIS — R911 Solitary pulmonary nodule: Secondary | ICD-10-CM

## 2019-07-06 ENCOUNTER — Encounter (HOSPITAL_COMMUNITY): Payer: Self-pay

## 2019-07-06 ENCOUNTER — Ambulatory Visit (HOSPITAL_COMMUNITY): Payer: Medicare HMO

## 2019-07-14 ENCOUNTER — Ambulatory Visit: Payer: Medicare HMO | Admitting: Neurology

## 2019-07-24 ENCOUNTER — Other Ambulatory Visit: Payer: Self-pay

## 2019-07-24 ENCOUNTER — Ambulatory Visit (HOSPITAL_COMMUNITY)
Admission: RE | Admit: 2019-07-24 | Discharge: 2019-07-24 | Disposition: A | Discharge status: Home / Self Care | Payer: Medicare HMO | Source: Ambulatory Visit | Attending: Internal Medicine | Admitting: Internal Medicine

## 2019-07-24 DIAGNOSIS — R911 Solitary pulmonary nodule: Secondary | ICD-10-CM | POA: Insufficient documentation

## 2019-07-24 DIAGNOSIS — Z23 Encounter for immunization: Secondary | ICD-10-CM | POA: Diagnosis not present

## 2019-07-24 DIAGNOSIS — R918 Other nonspecific abnormal finding of lung field: Secondary | ICD-10-CM | POA: Diagnosis not present

## 2019-07-28 ENCOUNTER — Ambulatory Visit: Payer: Self-pay | Admitting: Neurology
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
## 2019-07-29 ENCOUNTER — Encounter: Payer: Self-pay | Admitting: Neurology

## 2019-07-29 ENCOUNTER — Other Ambulatory Visit: Payer: Self-pay

## 2019-07-29 ENCOUNTER — Ambulatory Visit (INDEPENDENT_AMBULATORY_CARE_PROVIDER_SITE_OTHER): Payer: Medicare HMO | Admitting: Neurology

## 2019-07-29 VITALS — BP 140/70 | HR 79 | Temp 97.8°F | Ht 65.0 in | Wt 195.2 lb

## 2019-07-29 DIAGNOSIS — R519 Headache, unspecified: Secondary | ICD-10-CM | POA: Diagnosis not present

## 2019-07-29 DIAGNOSIS — G4486 Cervicogenic headache: Secondary | ICD-10-CM

## 2019-07-29 NOTE — Patient Instructions (Signed)
You may try to come off Meloxicam, take 1/2 tablet for 2 weeks, then you may stop the medicine.

## 2019-07-29 NOTE — Progress Notes (Signed)
I have read the note, and I agree with the clinical assessment and plan.  Alison House   

## 2019-09-29 DIAGNOSIS — M94261 Chondromalacia, right knee: Secondary | ICD-10-CM | POA: Diagnosis not present

## 2019-10-19 ENCOUNTER — Other Ambulatory Visit: Payer: Self-pay

## 2019-10-19 ENCOUNTER — Ambulatory Visit: Payer: Medicare HMO | Attending: Internal Medicine

## 2019-10-19 DIAGNOSIS — Z20822 Contact with and (suspected) exposure to covid-19: Secondary | ICD-10-CM | POA: Diagnosis not present

## 2019-10-20 LAB — NOVEL CORONAVIRUS, NAA: SARS-CoV-2, NAA: NOT DETECTED

## 2019-10-22 DIAGNOSIS — K219 Gastro-esophageal reflux disease without esophagitis: Secondary | ICD-10-CM | POA: Diagnosis not present

## 2019-10-22 DIAGNOSIS — Z79899 Other long term (current) drug therapy: Secondary | ICD-10-CM | POA: Diagnosis not present

## 2019-10-22 DIAGNOSIS — F329 Major depressive disorder, single episode, unspecified: Secondary | ICD-10-CM | POA: Diagnosis not present

## 2019-10-22 DIAGNOSIS — M1990 Unspecified osteoarthritis, unspecified site: Secondary | ICD-10-CM | POA: Diagnosis not present

## 2019-10-22 DIAGNOSIS — F419 Anxiety disorder, unspecified: Secondary | ICD-10-CM | POA: Diagnosis not present

## 2019-10-29 DIAGNOSIS — M1990 Unspecified osteoarthritis, unspecified site: Secondary | ICD-10-CM | POA: Diagnosis not present

## 2019-10-29 DIAGNOSIS — I700 Atherosclerosis of aorta: Secondary | ICD-10-CM | POA: Diagnosis not present

## 2019-10-29 DIAGNOSIS — S0990XA Unspecified injury of head, initial encounter: Secondary | ICD-10-CM | POA: Diagnosis not present

## 2019-11-03 DIAGNOSIS — M1711 Unilateral primary osteoarthritis, right knee: Secondary | ICD-10-CM | POA: Diagnosis not present

## 2019-11-03 DIAGNOSIS — S8001XD Contusion of right knee, subsequent encounter: Secondary | ICD-10-CM | POA: Diagnosis not present

## 2020-01-02 ENCOUNTER — Ambulatory Visit
Admission: EM | Admit: 2020-01-02 | Discharge: 2020-01-02 | Disposition: A | Discharge status: Home / Self Care | Payer: Medicare HMO | Attending: Emergency Medicine | Admitting: Emergency Medicine

## 2020-01-02 ENCOUNTER — Other Ambulatory Visit: Payer: Self-pay

## 2020-01-02 DIAGNOSIS — L237 Allergic contact dermatitis due to plants, except food: Secondary | ICD-10-CM

## 2020-01-02 DIAGNOSIS — L299 Pruritus, unspecified: Secondary | ICD-10-CM

## 2020-01-02 MED ORDER — TRIAMCINOLONE ACETONIDE 0.1 % EX CREA: 1.0000 "application " | TOPICAL_CREAM | Freq: Two times a day (BID) | CUTANEOUS | 0 refills | Status: AC

## 2020-01-02 MED ORDER — DEXAMETHASONE SODIUM PHOSPHATE 10 MG/ML IJ SOLN
10.0000 mg | Freq: Once | INTRAMUSCULAR | Status: AC
  Administered 2020-01-02: 10 mg via INTRAMUSCULAR

## 2020-01-02 NOTE — ED Triage Notes (Signed)
Provider at bedside, see provider note

## 2020-01-02 NOTE — ED Provider Notes (Signed)
Alison House   [id number] 01/02/20 Arrival Time: 0801  CC: poison ivy rash  SUBJECTIVE:  Alison House is a 74 y.o. female who presents with a poison ivy rash x 1 week.  Symptoms began after weeding her garden.  Rash diffuse about the upper extremities and torso.  Describes it as itchy, red, and spreading.  Has tried prednisone taper prescribed by PCP and OTC hydrocortisone cream with minimal relief.  Symptoms are made worse with scratching.  Reports similar symptoms in the past that improved with shot.   Denies fever, chills, nausea, vomiting, SOB, chest pain, abdominal pain, changes in bowel or bladder function.    ROS: As per HPI.  All other pertinent ROS negative.     Past Medical History:  Diagnosis Date  . 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 Anxiety   . Asthma    As a child  . Cervical strain, initial encounter 04/13/2019  . Cervicogenic headache 04/13/2019  . Post concussion syndrome 04/13/2019   Past Surgical History:  Procedure Laterality Date  . CHOLECYSTECTOMY    . COLONOSCOPY N/A 12/13/2016   Procedure: COLONOSCOPY;  Surgeon: Rogene Houston, MD;  [facility];  Service: Endoscopy;  Laterality: N/A;  730  . KNEE ARTHROSCOPY     Allergies  Allergen Reactions  . Sulfur Nausea Only   No current facility-administered medications on file prior to encounter.   Current Outpatient Medications on File Prior to Encounter  Medication Sig Dispense Refill  . Glucosamine HCl-MSM (GLUCOSAMINE-MSM PO) Take 1 tablet by mouth daily.    Marland Kitchen ibuprofen (ADVIL,MOTRIN) 200 MG tablet Take 200-600 mg by mouth daily as needed for headache or moderate pain.    Marland Kitchen LORazepam (ATIVAN) 0.5 MG tablet Take 0.5 mg by mouth at bedtime as needed for anxiety or sleep.    . Magnesium 250 MG TABS Take 250 mg by mouth at bedtime.    . traZODone (DESYREL) 100 MG tablet Take 100 mg by mouth at bedtime. Taking 1/2 tab prn at bedtime    . vitamin C (ASCORBIC ACID) 500 MG tablet Take 500  mg by mouth 2 (two) times daily.     Social History   Socioeconomic History  . Marital status: Divorced    Spouse name: Not on file  . Number of children: Not on file  . Years of education: Not on file  . Highest education level: 12th grade  Occupational History  . Occupation: Sales    Comment: [employer]   Tobacco Use  . Smoking status: Never Smoker  . Smokeless tobacco: Never Used  Substance and Sexual Activity  . Alcohol use: No  . Drug use: No  . Sexual activity: Not on file  Other Topics Concern  . Not on file  Social History Narrative   Right handed ]   2 to 3 cups of coffee per day    Lives at home alone    Social Determinants of Health   Financial Resource Strain:   . Difficulty of Paying Living Expenses:   Food Insecurity:   . Worried About Charity fundraiser in the Last Year:   . Arboriculturist in the Last Year:   Transportation Needs:   . Film/video editor (Medical):   Marland Kitchen Lack of Transportation (Non-Medical):   Physical Activity:   . Days of Exercise per Week:   . Minutes of Exercise per Session:   Stress:   . Feeling of Stress :  Social Connections:   . Frequency of Communication with Friends and Family:   . Frequency of Social Gatherings with Friends and Family:   . Attends Religious Services:   . Active Member of Clubs or Organizations:   . Attends Banker Meetings:   Marland Kitchen Marital Status:   Intimate Partner Violence:   . Fear of Current or Ex-Partner:   . Emotionally Abused:   Marland Kitchen Physically Abused:   . Sexually Abused:    Family History  Problem Relation Age of Onset  . Dementia Mother   . Diabetes Father   . Heart Problems Father   . Aneurysm Brother     OBJECTIVE: Vitals:   01/02/20 0814  BP: (!) 188/53  Pulse: 91  Resp: 17  Temp: 98.2 F (36.8 C)  TempSrc: Oral  SpO2: 97%    General appearance: alert; no distress Head: NCAT Lungs: Normal respiratory effort Extremities: no edema Skin: warm and dry; areas of  linear papules and macules with surrounding erythema localized to bilateral forearms, and anterior lower trunk and LT flank with excoriations Psychological: alert and cooperative; normal mood and affect  ASSESSMENT & PLAN:  1. Contact dermatitis due to poison ivy   2. Itching     Meds ordered this encounter  Medications  . triamcinolone cream (KENALOG) 0.1 %    Sig: Apply 1 application topically 2 (two) times daily.    Dispense:  80 g    Refill:  0    Order Specific Question:   Supervising Provider    Answer:   Eustace Moore [[id number]]  . dexamethasone (DECADRON) injection 10 mg   Wash with warm water and mild soap Steroid shot given in office Continue with prednisone as prescribed Triamcinolone cream prescribed.  Use as directed Use OTC zyrtec, allegra, or claritin during the day.  Benadryl at night. You may also use OTC hydrocortisone cream and/or calamine lotion to help alleviate itching Follow up with PCP Return or go to the ED if you have any new or worsening symptoms such as fever, chills, nausea, vomiting, difficulty breathing, throat swelling, tongue swelling, numbness/ tingling in mouth, worsening symptoms despite treatment, etc...  Reviewed expectations re: course of current medical issues. Questions answered. Outlined signs and symptoms indicating need for more acute intervention. Patient verbalized understanding. After Visit Summary given.   Rennis Harding, PA-C 01/02/20 0825

## 2020-01-02 NOTE — Discharge Instructions (Signed)
Wash with warm water and mild soap Steroid shot given in office Continue with prednisone as prescribed Triamcinolone cream prescribed.  Use as directed Use OTC zyrtec, allegra, or claritin during the day.  Benadryl at night. You may also use OTC hydrocortisone cream and/or calamine lotion to help alleviate itching Follow up with PCP Return or go to the ED if you have any new or worsening symptoms such as fever, chills, nausea, vomiting, difficulty breathing, throat swelling, tongue swelling, numbness/ tingling in mouth, worsening symptoms despite treatment, etc..Marland Kitchen

## 2020-01-07 DIAGNOSIS — R21 Rash and other nonspecific skin eruption: Secondary | ICD-10-CM | POA: Diagnosis not present

## 2020-01-21 DIAGNOSIS — E785 Hyperlipidemia, unspecified: Secondary | ICD-10-CM | POA: Diagnosis not present

## 2020-01-21 DIAGNOSIS — Z0000 Encounter for general adult medical examination without abnormal findings: Secondary | ICD-10-CM | POA: Diagnosis not present

## 2020-01-21 DIAGNOSIS — Z87828 Personal history of other (healed) physical injury and trauma: Secondary | ICD-10-CM | POA: Diagnosis not present

## 2020-01-21 DIAGNOSIS — E6609 Other obesity due to excess calories: Secondary | ICD-10-CM | POA: Diagnosis not present

## 2020-01-21 DIAGNOSIS — Z6831 Body mass index (BMI) 31.0-31.9, adult: Secondary | ICD-10-CM | POA: Diagnosis not present

## 2020-01-27 ENCOUNTER — Other Ambulatory Visit: Payer: Self-pay

## 2020-01-27 ENCOUNTER — Encounter: Payer: Self-pay | Admitting: Neurology

## 2020-01-27 ENCOUNTER — Ambulatory Visit: Payer: Medicare HMO | Admitting: Neurology

## 2020-01-27 VITALS — BP 138/74 | HR 84 | Temp 97.0°F | Ht 65.0 in | Wt 197.0 lb

## 2020-01-27 DIAGNOSIS — F0781 Postconcussional syndrome: Secondary | ICD-10-CM | POA: Diagnosis not present

## 2020-01-27 DIAGNOSIS — R42 Dizziness and giddiness: Secondary | ICD-10-CM

## 2020-01-27 NOTE — Progress Notes (Signed)
I have read the note, and I agree with the clinical assessment and plan.  Chinenye Katzenberger K Tommie Bohlken   
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
## 2020-01-27 NOTE — Patient Instructions (Signed)
It was nice to see you today  I am glad you feel you have improved Let me talk to Dr. Anne Hahn and get back to you See you back in 6 months

## 2020-02-16 DIAGNOSIS — R42 Dizziness and giddiness: Secondary | ICD-10-CM | POA: Diagnosis not present

## 2020-02-16 DIAGNOSIS — G44329 Chronic post-traumatic headache, not intractable: Secondary | ICD-10-CM | POA: Diagnosis not present

## 2020-02-16 DIAGNOSIS — M542 Cervicalgia: Secondary | ICD-10-CM | POA: Diagnosis not present

## 2020-02-16 DIAGNOSIS — S0990XD Unspecified injury of head, subsequent encounter: Secondary | ICD-10-CM | POA: Diagnosis not present

## 2020-03-04 DIAGNOSIS — S0990XA Unspecified injury of head, initial encounter: Secondary | ICD-10-CM | POA: Diagnosis not present

## 2020-03-04 DIAGNOSIS — S0990XD Unspecified injury of head, subsequent encounter: Secondary | ICD-10-CM | POA: Diagnosis not present

## 2020-03-04 DIAGNOSIS — M542 Cervicalgia: Secondary | ICD-10-CM | POA: Diagnosis not present

## 2020-03-22 DIAGNOSIS — J0100 Acute maxillary sinusitis, unspecified: Secondary | ICD-10-CM | POA: Diagnosis not present

## 2020-03-22 DIAGNOSIS — S0990XD Unspecified injury of head, subsequent encounter: Secondary | ICD-10-CM | POA: Diagnosis not present

## 2020-03-22 DIAGNOSIS — G44329 Chronic post-traumatic headache, not intractable: Secondary | ICD-10-CM | POA: Diagnosis not present

## 2020-03-24 ENCOUNTER — Other Ambulatory Visit: Payer: Self-pay

## 2020-03-24 ENCOUNTER — Encounter: Payer: Self-pay | Admitting: Neurology

## 2020-03-24 ENCOUNTER — Ambulatory Visit: Payer: Medicare HMO | Admitting: Neurology

## 2020-03-24 VITALS — BP 124/61 | HR 82 | Ht 65.0 in | Wt 190.0 lb

## 2020-03-24 DIAGNOSIS — R42 Dizziness and giddiness: Secondary | ICD-10-CM | POA: Diagnosis not present

## 2020-03-24 DIAGNOSIS — F0781 Postconcussional syndrome: Secondary | ICD-10-CM | POA: Diagnosis not present

## 2020-03-24 MED ORDER — NORTRIPTYLINE HCL 10 MG PO CAPS: 10.0000 mg | ORAL_CAPSULE | Freq: Every day | ORAL | 3 refills | Status: DC

## 2020-03-24 NOTE — Patient Instructions (Addendum)
Start nortriptyline 10 mg at bedtime to help with occipital pain and headache  I will send you to neuro rehab  Keep appointment in October

## 2020-03-24 NOTE — Progress Notes (Signed)
I have read the note, and I agree with the clinical assessment and plan.  Aneita Kiger K Calisa Luckenbaugh   
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
## 2020-04-01 ENCOUNTER — Ambulatory Visit: Payer: Medicare HMO

## 2020-04-05 ENCOUNTER — Other Ambulatory Visit: Payer: Self-pay

## 2020-04-05 ENCOUNTER — Ambulatory Visit: Payer: Medicare HMO | Attending: Neurology

## 2020-04-05 DIAGNOSIS — R2681 Unsteadiness on feet: Secondary | ICD-10-CM | POA: Insufficient documentation

## 2020-04-05 DIAGNOSIS — R42 Dizziness and giddiness: Secondary | ICD-10-CM | POA: Diagnosis not present

## 2020-04-05 DIAGNOSIS — R293 Abnormal posture: Secondary | ICD-10-CM | POA: Insufficient documentation

## 2020-04-05 DIAGNOSIS — M6281 Muscle weakness (generalized): Secondary | ICD-10-CM | POA: Diagnosis not present

## 2020-04-05 DIAGNOSIS — M542 Cervicalgia: Secondary | ICD-10-CM
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
## 2020-04-08 ENCOUNTER — Other Ambulatory Visit: Payer: Self-pay

## 2020-04-08 ENCOUNTER — Ambulatory Visit: Payer: Medicare HMO

## 2020-04-08 DIAGNOSIS — R42 Dizziness and giddiness: Secondary | ICD-10-CM | POA: Diagnosis not present

## 2020-04-08 DIAGNOSIS — R2681 Unsteadiness on feet: Secondary | ICD-10-CM | POA: Diagnosis not present

## 2020-04-08 DIAGNOSIS — M542 Cervicalgia: Secondary | ICD-10-CM | POA: Diagnosis not present

## 2020-04-08 DIAGNOSIS — M6281 Muscle weakness (generalized): Secondary | ICD-10-CM | POA: Diagnosis not present

## 2020-04-08 DIAGNOSIS — R293 Abnormal posture: Secondary | ICD-10-CM
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
## 2020-04-14 ENCOUNTER — Other Ambulatory Visit: Payer: Self-pay

## 2020-04-14 ENCOUNTER — Ambulatory Visit: Payer: Medicare HMO

## 2020-04-14 DIAGNOSIS — R42 Dizziness and giddiness: Secondary | ICD-10-CM

## 2020-04-14 DIAGNOSIS — R2681 Unsteadiness on feet: Secondary | ICD-10-CM | POA: Diagnosis not present

## 2020-04-14 DIAGNOSIS — M6281 Muscle weakness (generalized): Secondary | ICD-10-CM | POA: Diagnosis not present

## 2020-04-14 DIAGNOSIS — R293 Abnormal posture: Secondary | ICD-10-CM | POA: Diagnosis not present

## 2020-04-14 DIAGNOSIS — M542 Cervicalgia: Secondary | ICD-10-CM | POA: Diagnosis not present
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
## 2020-04-19 ENCOUNTER — Ambulatory Visit: Payer: Medicare HMO

## 2020-04-19 ENCOUNTER — Other Ambulatory Visit: Payer: Self-pay

## 2020-04-19 DIAGNOSIS — R293 Abnormal posture: Secondary | ICD-10-CM | POA: Diagnosis not present

## 2020-04-19 DIAGNOSIS — M6281 Muscle weakness (generalized): Secondary | ICD-10-CM

## 2020-04-19 DIAGNOSIS — R2681 Unsteadiness on feet: Secondary | ICD-10-CM | POA: Diagnosis not present

## 2020-04-19 DIAGNOSIS — R42 Dizziness and giddiness: Secondary | ICD-10-CM | POA: Diagnosis not present

## 2020-04-19 DIAGNOSIS — M542 Cervicalgia: Secondary | ICD-10-CM
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
## 2020-04-26 ENCOUNTER — Ambulatory Visit: Payer: Medicare HMO

## 2020-04-26 ENCOUNTER — Other Ambulatory Visit: Payer: Self-pay

## 2020-04-26 DIAGNOSIS — R2681 Unsteadiness on feet: Secondary | ICD-10-CM | POA: Diagnosis not present

## 2020-04-26 DIAGNOSIS — R42 Dizziness and giddiness: Secondary | ICD-10-CM

## 2020-04-26 DIAGNOSIS — M6281 Muscle weakness (generalized): Secondary | ICD-10-CM

## 2020-04-26 DIAGNOSIS — R293 Abnormal posture: Secondary | ICD-10-CM | POA: Diagnosis not present

## 2020-04-26 DIAGNOSIS — M542 Cervicalgia: Secondary | ICD-10-CM
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
## 2020-05-03 ENCOUNTER — Other Ambulatory Visit: Payer: Self-pay

## 2020-05-03 ENCOUNTER — Ambulatory Visit: Payer: Medicare HMO | Attending: Neurology

## 2020-05-03 DIAGNOSIS — M542 Cervicalgia: Secondary | ICD-10-CM | POA: Diagnosis not present

## 2020-05-03 DIAGNOSIS — R42 Dizziness and giddiness: Secondary | ICD-10-CM | POA: Diagnosis not present

## 2020-05-03 DIAGNOSIS — M6281 Muscle weakness (generalized): Secondary | ICD-10-CM | POA: Insufficient documentation

## 2020-05-03 DIAGNOSIS — R293 Abnormal posture: Secondary | ICD-10-CM | POA: Diagnosis not present

## 2020-05-03 DIAGNOSIS — R2681 Unsteadiness on feet: Secondary | ICD-10-CM | POA: Diagnosis not present
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
## 2020-05-06 ENCOUNTER — Ambulatory Visit: Payer: Medicare HMO

## 2020-05-06 DIAGNOSIS — Z124 Encounter for screening for malignant neoplasm of cervix: Secondary | ICD-10-CM | POA: Diagnosis not present

## 2020-05-06 DIAGNOSIS — Z01419 Encounter for gynecological examination (general) (routine) without abnormal findings: Secondary | ICD-10-CM | POA: Diagnosis not present

## 2020-05-10 ENCOUNTER — Ambulatory Visit: Payer: Medicare HMO

## 2020-05-13 ENCOUNTER — Other Ambulatory Visit (HOSPITAL_COMMUNITY): Payer: Self-pay | Admitting: Family Medicine

## 2020-05-13 ENCOUNTER — Ambulatory Visit: Payer: Medicare HMO

## 2020-05-13 DIAGNOSIS — Z0000 Encounter for general adult medical examination without abnormal findings: Secondary | ICD-10-CM

## 2020-05-16 ENCOUNTER — Ambulatory Visit: Payer: Medicare HMO

## 2020-05-19 ENCOUNTER — Ambulatory Visit: Payer: Medicare HMO

## 2020-05-24 ENCOUNTER — Ambulatory Visit: Payer: Medicare HMO

## 2020-05-26 ENCOUNTER — Ambulatory Visit: Payer: Medicare HMO

## 2020-05-27 ENCOUNTER — Other Ambulatory Visit: Payer: Self-pay

## 2020-05-27 ENCOUNTER — Encounter (INDEPENDENT_AMBULATORY_CARE_PROVIDER_SITE_OTHER): Payer: Self-pay | Admitting: Otolaryngology

## 2020-05-27 ENCOUNTER — Ambulatory Visit (INDEPENDENT_AMBULATORY_CARE_PROVIDER_SITE_OTHER): Payer: Medicare HMO | Admitting: Otolaryngology

## 2020-05-27 VITALS — Temp 97.9°F

## 2020-05-27 DIAGNOSIS — J310 Chronic rhinitis: Secondary | ICD-10-CM | POA: Diagnosis not present

## 2020-05-27 DIAGNOSIS — H6123 Impacted cerumen, bilateral: Secondary | ICD-10-CM | POA: Diagnosis not present
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
## 2020-05-30 ENCOUNTER — Ambulatory Visit (HOSPITAL_COMMUNITY): Payer: Medicare HMO

## 2020-06-03 ENCOUNTER — Ambulatory Visit (HOSPITAL_COMMUNITY)
Admission: RE | Admit: 2020-06-03 | Discharge: 2020-06-03 | Disposition: A | Discharge status: Home / Self Care | Payer: Medicare HMO | Source: Ambulatory Visit | Attending: Family Medicine | Admitting: Family Medicine

## 2020-06-03 ENCOUNTER — Other Ambulatory Visit: Payer: Self-pay

## 2020-06-03 DIAGNOSIS — Z1231 Encounter for screening mammogram for malignant neoplasm of breast: Secondary | ICD-10-CM | POA: Diagnosis present

## 2020-06-03 DIAGNOSIS — Z0000 Encounter for general adult medical examination without abnormal findings: Secondary | ICD-10-CM

## 2020-06-10 ENCOUNTER — Ambulatory Visit (HOSPITAL_COMMUNITY): Payer: Medicare HMO

## 2020-07-28 ENCOUNTER — Ambulatory Visit: Payer: Medicare HMO | Admitting: Neurology

## 2020-07-28 ENCOUNTER — Encounter: Payer: Self-pay | Admitting: Neurology

## 2020-07-28 ENCOUNTER — Other Ambulatory Visit: Payer: Self-pay

## 2020-07-28 VITALS — BP 144/78 | HR 72 | Ht 65.0 in | Wt 190.0 lb

## 2020-07-28 DIAGNOSIS — G4486 Cervicogenic headache: Secondary | ICD-10-CM | POA: Diagnosis not present

## 2020-07-28 DIAGNOSIS — F0781 Postconcussional syndrome: Secondary | ICD-10-CM | POA: Diagnosis not present

## 2020-07-28 NOTE — Patient Instructions (Signed)
Continue doing your therapy exercises  Continue to see your primary doctor  See you back as needed

## 2020-07-28 NOTE — Progress Notes (Signed)
I have read the note, and I agree with the clinical assessment and plan.  Kassius Battiste K Mika Griffitts   
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
## 2020-07-28 NOTE — Progress Notes (Signed)
I have read the note, and I agree with the clinical assessment and plan.  Shallyn Constancio K Destinae Neubecker   

## 2020-11-27 IMAGING — MG DIGITAL SCREENING BILAT W/ TOMO W/ CAD
8 series · 8 of 24 positions shown · non-contrast
Comparison: Previous exam(s).

CLINICAL DATA: Screening.

EXAM:
DIGITAL SCREENING BILATERAL MAMMOGRAM WITH TOMO AND CAD

[R CC synth-2D]
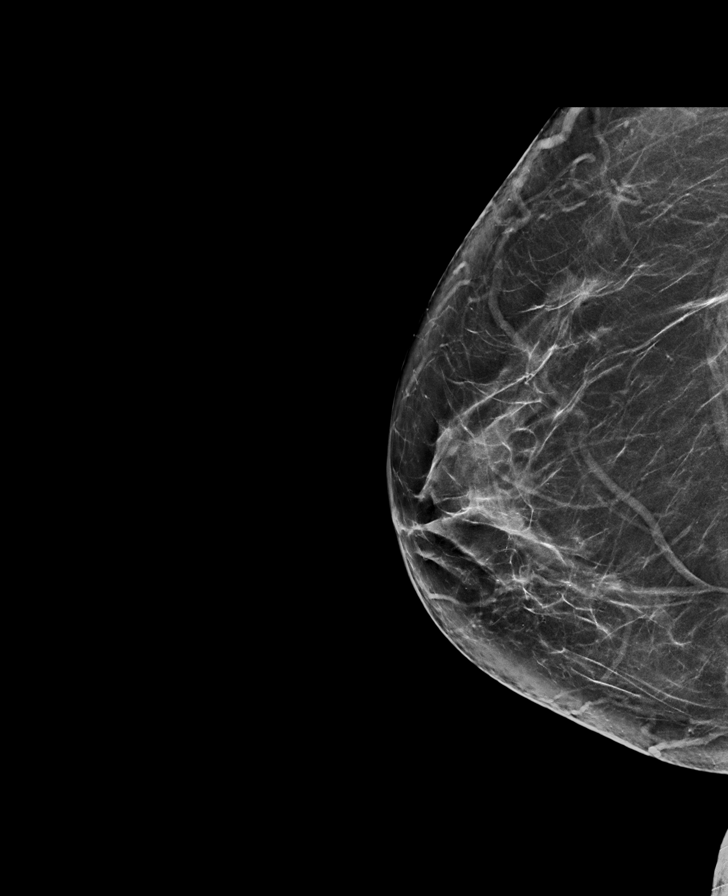

[L CC synth-2D]
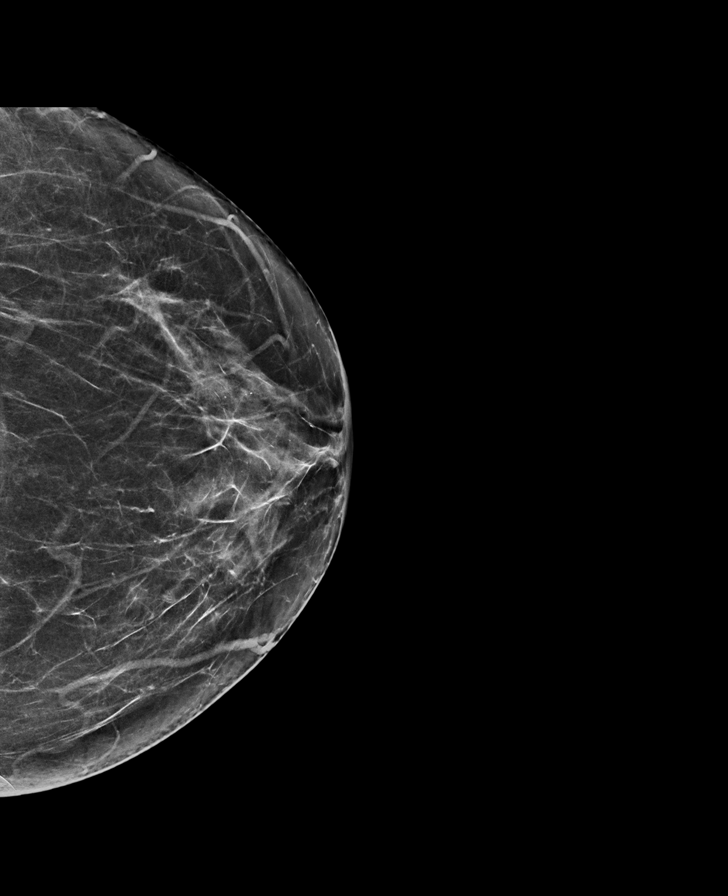

[L MLO synth-2D]
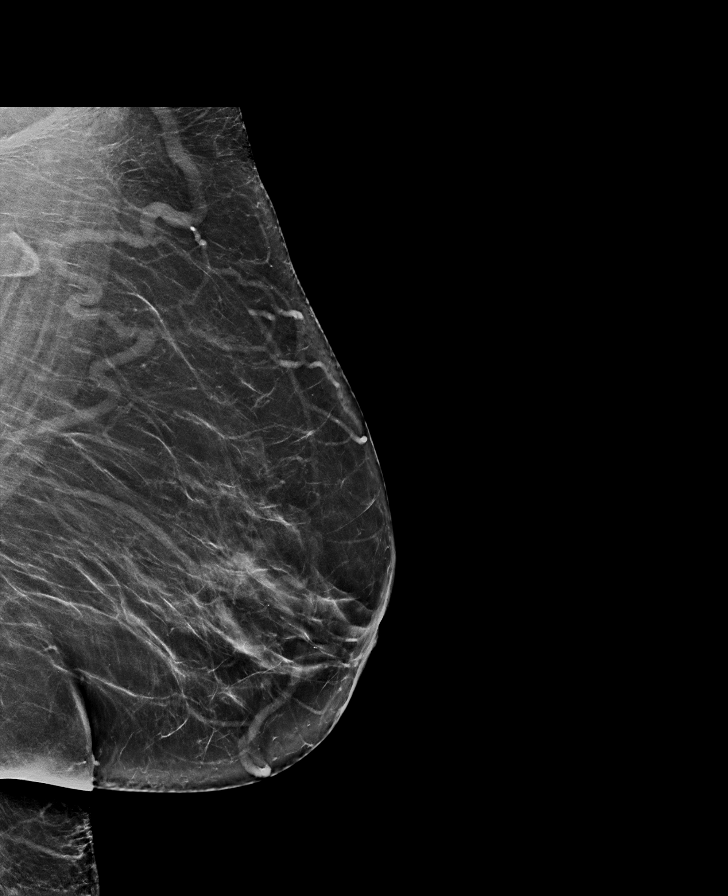

[R MLO synth-2D]
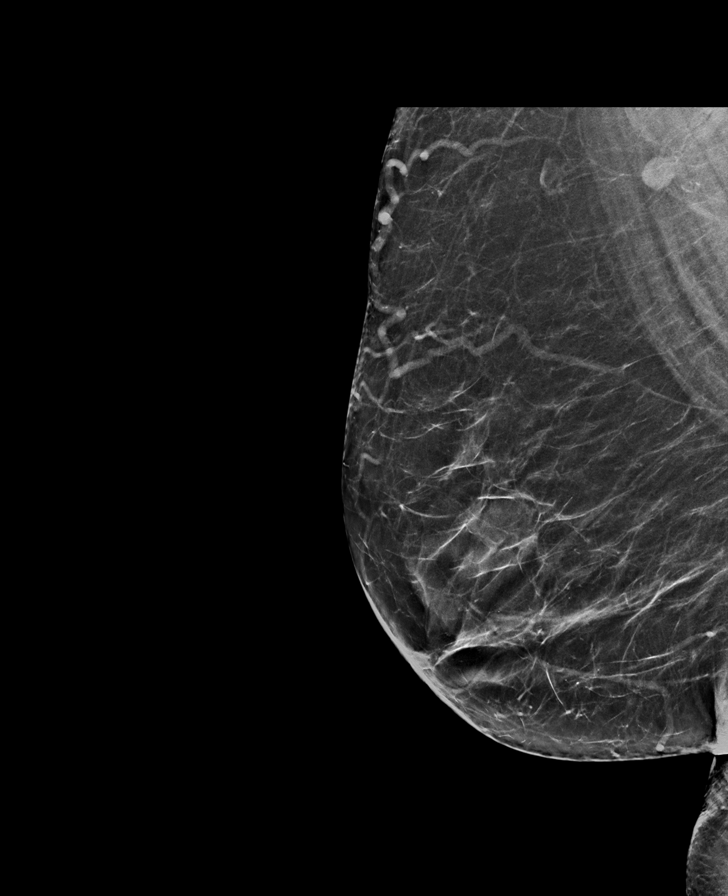

[L MLO tomo · tomo slice 39/76.0]
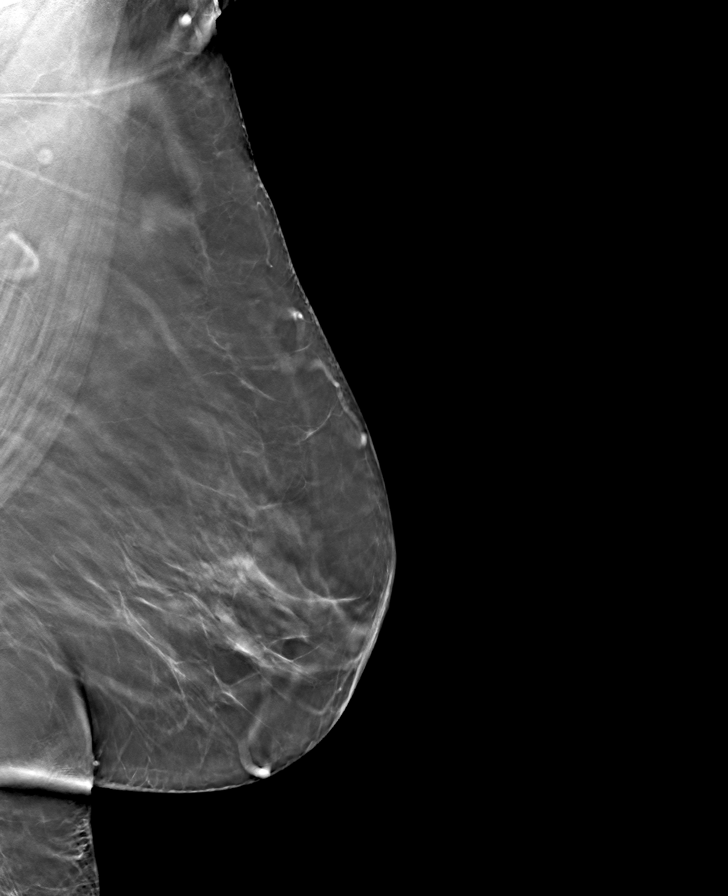

[R MLO tomo · tomo slice 34/67.0]
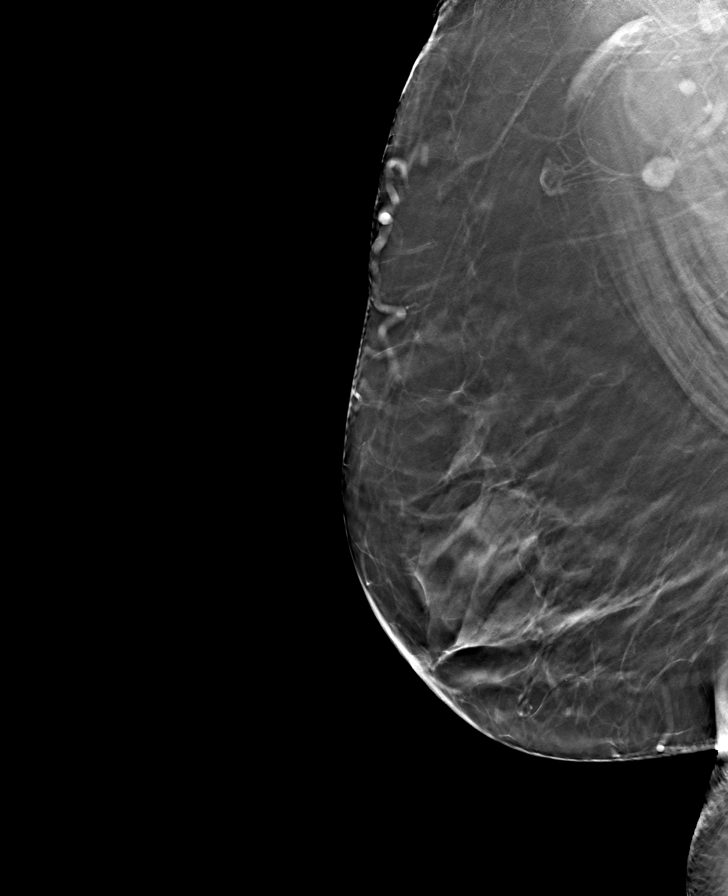

[R CC tomo · tomo slice 37/72.0]
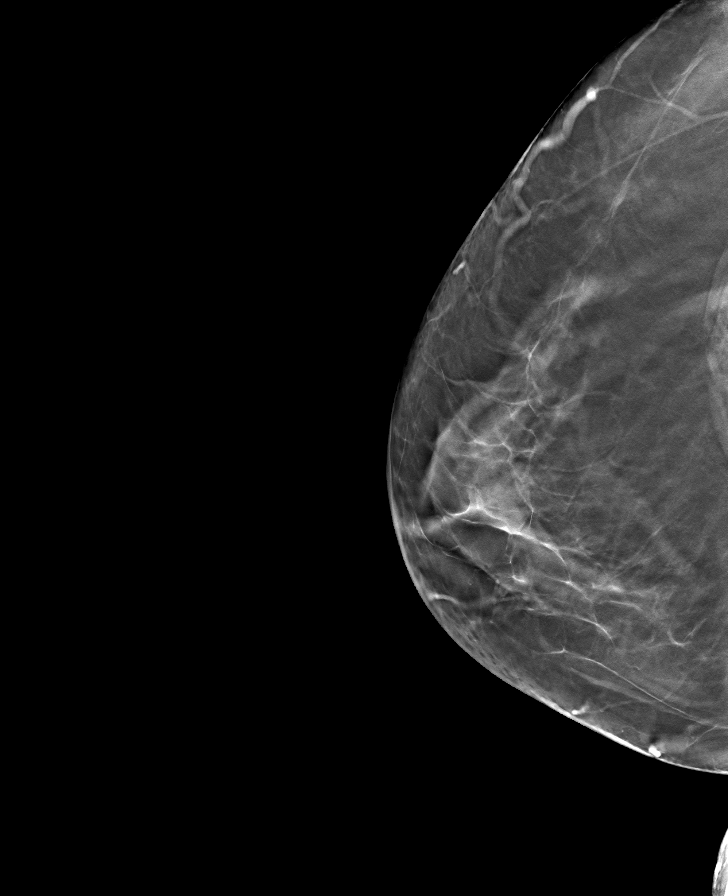

[L CC tomo · tomo slice 33/65.0]
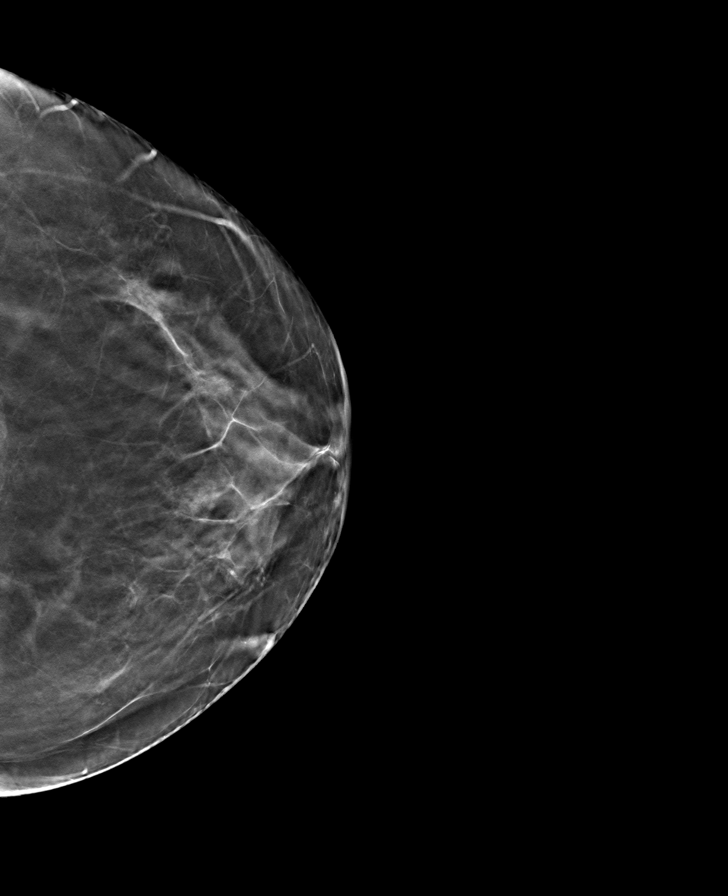

[8 of 24 positions shown; findings below may reference images not displayed]

ACR Breast Density Category b: There are scattered areas of
fibroglandular density.
FINDINGS: There are no findings suspicious for malignancy. Images were
processed with CAD.
IMPRESSION: No mammographic evidence of malignancy. A result letter of this
screening mammogram will be mailed directly to the patient.

RECOMMENDATION:
Screening mammogram in one year. (Code:[id number])

BI-RADS CATEGORY  1: Negative.

## 2020-12-24 ENCOUNTER — Encounter: Payer: Self-pay | Admitting: Emergency Medicine

## 2020-12-24 ENCOUNTER — Ambulatory Visit
Admission: EM | Admit: 2020-12-24 | Discharge: 2020-12-24 | Disposition: A | Discharge status: Home / Self Care | Payer: Medicare HMO | Attending: Physician Assistant | Admitting: Physician Assistant

## 2020-12-24 ENCOUNTER — Other Ambulatory Visit: Payer: Self-pay

## 2020-12-24 DIAGNOSIS — R059 Cough, unspecified: Secondary | ICD-10-CM

## 2020-12-24 DIAGNOSIS — J0120 Acute ethmoidal sinusitis, unspecified: Secondary | ICD-10-CM | POA: Diagnosis not present

## 2020-12-24 MED ORDER — AMOXICILLIN 500 MG PO CAPS: 500.0000 mg | ORAL_CAPSULE | Freq: Two times a day (BID) | ORAL | 0 refills | Status: AC

## 2020-12-24 NOTE — ED Provider Notes (Signed)
[facility]    CSN: [id number] Arrival date & time: 12/24/20  0802      History   Chief Complaint No chief complaint on file.   HPI Alison House is a 75 y.o. female.   Who presents with a 8 day history of congestion, rhinorrhea, fevers and now cough. Generalized malaise is also noted. She is concerned due to ongoing cough, sinus pressure and teeth pain. FEvers have subsided over the last few days, but continues to feel poorly. Taking Sudafed without much relief.      Past Medical History:  Diagnosis Date  . 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 Anxiety   . Asthma    As a child  . Cervical strain, initial encounter 04/13/2019  . Cervicogenic headache 04/13/2019  . Post concussion syndrome 04/13/2019    Patient Active Problem List   Diagnosis Date Noted  . Episodic lightheadedness 01/27/2020  . Post concussion syndrome 04/13/2019  . 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 Cervicogenic headache 04/13/2019  . Cervical strain, initial encounter 04/13/2019  . History of colonic polyps 11/07/2016  . Stiffness of joint, not elsewhere classified, lower leg 07/28/2013  . Pain in knee 07/28/2013  . Swelling of joint of left knee 07/28/2013    Past Surgical History:  Procedure Laterality Date  . CHOLECYSTECTOMY    . COLONOSCOPY N/A 12/13/2016   Procedure: COLONOSCOPY;  Surgeon: Malissa Hippo, MD;  Location: [facility];  Service: Endoscopy;  Laterality: N/A;  730  . KNEE ARTHROSCOPY      OB History   No obstetric history on file.      Home Medications    Prior to Admission medications   Medication Sig Start Date End Date Taking? Authorizing Provider  Glucosamine HCl-MSM (GLUCOSAMINE-MSM PO) Take 1 tablet by mouth daily.    [provider]  ibuprofen (ADVIL,MOTRIN) 200 MG tablet Take 200-600 mg by mouth daily as needed for headache or moderate pain.    [provider]  LORazepam (ATIVAN) 0.5 MG tablet Take 0.5 mg by mouth at bedtime as needed for  anxiety or sleep.    [provider]  Magnesium 250 MG TABS Take 250 mg by mouth at bedtime.    [provider]  traZODone (DESYREL) 100 MG tablet Take 100 mg by mouth at bedtime. Taking 1/2 tab prn at bedtime    [provider]  triamcinolone cream (KENALOG) 0.1 % Apply 1 application topically 2 (two) times daily. 01/02/20   Wurst, [place], PA-C  vitamin C (ASCORBIC ACID) 500 MG tablet Take 500 mg by mouth 2 (two) times daily.     [provider]    Family History Family History  Problem Relation Age of Onset  . Dementia Mother   . Diabetes Father   . Heart Problems Father   . Aneurysm Brother     Social History Social History   Tobacco Use  . Smoking status: Never Smoker  . Smokeless tobacco: Never Used  Vaping Use  . Vaping Use: Never used  Substance Use Topics  . Alcohol use: No  . Drug use: No     Allergies   Elemental sulfur   Review of Systems Review of Systems  All other systems reviewed and are negative.    Physical Exam Triage Vital Signs ED Triage Vitals  Enc Vitals Group     BP      Pulse      Resp      Temp  Temp src      SpO2      Weight      Height      Head Circumference      Peak Flow      Pain Score      Pain Loc      Pain Edu?      Excl. in GC?    No data found.  Updated Vital Signs There were no vitals taken for this visit.  Visual Acuity Right Eye Distance:   Left Eye Distance:   Bilateral Distance:    Right Eye Near:   Left Eye Near:    Bilateral Near:     Physical Exam Vitals and nursing note reviewed.  Constitutional:      General: She is not in acute distress.    Appearance: Normal appearance. She is normal weight. She is not ill-appearing.  HENT:     Head: Normocephalic and atraumatic.     Comments: Pain to percussion along ethmoid sinus    Right Ear: Tympanic membrane normal.     Left Ear: Tympanic membrane normal.     Nose: Congestion and rhinorrhea present.      Mouth/Throat:     Mouth: Mucous membranes are moist.     Pharynx: No oropharyngeal exudate or posterior oropharyngeal erythema.  Eyes:     Conjunctiva/sclera: Conjunctivae normal.  Cardiovascular:     Rate and Rhythm: Normal rate and regular rhythm.  Pulmonary:     Effort: Pulmonary effort is normal.     Breath sounds: Normal breath sounds.  Skin:    General: Skin is warm and dry.  Neurological:     General: No focal deficit present.     Mental Status: She is alert.  Psychiatric:        Mood and Affect: Mood normal.        Behavior: Behavior normal.      UC Treatments / Results  Labs (all labs ordered are listed, but only abnormal results are displayed) Labs Reviewed - No data to display  EKG   Radiology No results found.  Procedures Procedures (including critical care time)  Medications Ordered in UC Medications - No data to display  Initial Impression / Assessment and Plan / UC Course  I have reviewed the triage vital signs and the nursing notes.  Pertinent labs & imaging results that were available during my care of the patient were reviewed by me and considered in my medical decision making (see chart for details).     Given duration and progression will treat with Amoxicillin for sinusitis. OTC Delsym and fluids. Call if worsens.  Final Clinical Impressions(s) / UC Diagnoses   Final diagnoses:  None   Discharge Instructions   None    ED Prescriptions    None     PDMP not reviewed this encounter.   Riki Sheer, [place] 12/24/20 [phone]

## 2020-12-24 NOTE — ED Triage Notes (Signed)
Sinus pressure that started last weekend, has progressed to cough and sore throat.

## 2020-12-24 NOTE — Discharge Instructions (Addendum)
Most likely a sinusitis. Lungs are clear. Would hold on the sudafed since your BP is elevated. Suggest a Claritin or zyrtec daily, nasal saline rinses and Delsym for cough. Finish all of your antibiotics. Stay hydrated. FU as needed.

## 2020-12-25 LAB — COVID-19, FLU A+B NAA
Influenza A, NAA: NOT DETECTED
Influenza B, NAA: NOT DETECTED
SARS-CoV-2, NAA: NOT DETECTED

## 2021-04-21 ENCOUNTER — Other Ambulatory Visit (HOSPITAL_COMMUNITY): Payer: Self-pay | Admitting: Family Medicine

## 2021-04-21 DIAGNOSIS — Z1231 Encounter for screening mammogram for malignant neoplasm of breast: Secondary | ICD-10-CM

## 2021-06-09 ENCOUNTER — Other Ambulatory Visit: Payer: Self-pay

## 2021-06-09 ENCOUNTER — Ambulatory Visit (HOSPITAL_COMMUNITY)
Admission: RE | Admit: 2021-06-09 | Discharge: 2021-06-09 | Disposition: A | Discharge status: Home / Self Care | Payer: Medicare HMO | Source: Ambulatory Visit | Attending: Family Medicine | Admitting: Family Medicine

## 2021-06-09 DIAGNOSIS — Z1231 Encounter for screening mammogram for malignant neoplasm of breast: Secondary | ICD-10-CM | POA: Diagnosis not present

## 2022-05-31 ENCOUNTER — Other Ambulatory Visit (HOSPITAL_COMMUNITY): Payer: Self-pay | Admitting: Family Medicine

## 2022-05-31 DIAGNOSIS — Z1231 Encounter for screening mammogram for malignant neoplasm of breast: Secondary | ICD-10-CM

## 2022-06-15 ENCOUNTER — Ambulatory Visit (HOSPITAL_COMMUNITY)
Admission: RE | Admit: 2022-06-15 | Discharge: 2022-06-15 | Disposition: A | Discharge status: Home / Self Care | Payer: Medicare HMO | Source: Ambulatory Visit | Attending: Family Medicine | Admitting: Family Medicine

## 2022-06-15 DIAGNOSIS — Z1231 Encounter for screening mammogram for malignant neoplasm of breast: Secondary | ICD-10-CM

## 2023-05-27 ENCOUNTER — Other Ambulatory Visit (HOSPITAL_COMMUNITY): Payer: Self-pay | Admitting: Family Medicine

## 2023-05-27 DIAGNOSIS — Z1231 Encounter for screening mammogram for malignant neoplasm of breast: Secondary | ICD-10-CM

## 2023-06-21 ENCOUNTER — Ambulatory Visit (HOSPITAL_COMMUNITY): Payer: Medicare HMO

## 2023-06-28 ENCOUNTER — Ambulatory Visit (HOSPITAL_COMMUNITY)
Admission: RE | Admit: 2023-06-28 | Discharge: 2023-06-28 | Disposition: A | Discharge status: Home / Self Care | Payer: Medicare HMO | Source: Ambulatory Visit | Attending: Family Medicine | Admitting: Family Medicine

## 2023-06-28 DIAGNOSIS — Z1231 Encounter for screening mammogram for malignant neoplasm of breast: Secondary | ICD-10-CM | POA: Insufficient documentation

## 2023-11-13 ENCOUNTER — Emergency Department (HOSPITAL_COMMUNITY)
Admission: EM | Admit: 2023-11-13 | Discharge: 2023-11-13 | Disposition: A | Discharge status: Home / Self Care | Payer: HMO

## 2023-11-13 ENCOUNTER — Other Ambulatory Visit: Payer: Self-pay

## 2023-11-13 ENCOUNTER — Emergency Department (HOSPITAL_COMMUNITY): Payer: HMO

## 2023-11-13 ENCOUNTER — Encounter (HOSPITAL_COMMUNITY): Payer: Self-pay

## 2023-11-13 DIAGNOSIS — Z20822 Contact with and (suspected) exposure to covid-19: Secondary | ICD-10-CM | POA: Insufficient documentation

## 2023-11-13 DIAGNOSIS — R42 Dizziness and giddiness: Secondary | ICD-10-CM | POA: Insufficient documentation

## 2023-11-13 LAB — CBC
HCT: 40.8 % (ref 36.0–46.0)
Hemoglobin: 14.2 g/dL (ref 12.0–15.0)
MCH: 32.8 pg (ref 26.0–34.0)
MCHC: 34.8 g/dL (ref 30.0–36.0)
MCV: 94.2 fL (ref 80.0–100.0)
Platelets: 192 10*3/uL (ref 150–400)
RBC: 4.33 MIL/uL (ref 3.87–5.11)
RDW: 12.4 % (ref 11.5–15.5)
WBC: 5.9 10*3/uL (ref 4.0–10.5)
nRBC: 0 % (ref 0.0–0.2)

## 2023-11-13 LAB — RESP PANEL BY RT-PCR (RSV, FLU A&B, COVID)  RVPGX2
Influenza A by PCR: NEGATIVE
Influenza B by PCR: NEGATIVE
Resp Syncytial Virus by PCR: NEGATIVE
SARS Coronavirus 2 by RT PCR: NEGATIVE

## 2023-11-13 LAB — BASIC METABOLIC PANEL
Anion gap: 9 (ref 5–15)
BUN: 13 mg/dL (ref 8–23)
CO2: 25 mmol/L (ref 22–32)
Calcium: 8.7 mg/dL — ABNORMAL LOW (ref 8.9–10.3)
Chloride: 105 mmol/L (ref 98–111)
Creatinine, Ser: 0.73 mg/dL (ref 0.44–1.00)
GFR, Estimated: >60 mL/min (ref >60)
Glucose, Bld: 100 mg/dL — ABNORMAL HIGH (ref 70–99)
Potassium: 4 mmol/L (ref 3.5–5.1)
Sodium: 139 mmol/L (ref 135–145)

## 2023-11-13 LAB — TROPONIN I (HIGH SENSITIVITY)
Troponin I (High Sensitivity): 4 ng/L (ref <18)
Troponin I (High Sensitivity): 3 ng/L (ref <18)

## 2023-11-13 MED ORDER — MECLIZINE HCL 25 MG PO TABS: 25.0000 mg | ORAL_TABLET | Freq: Three times a day (TID) | ORAL | 0 refills | Status: AC | PRN

## 2023-11-13 MED ORDER — ONDANSETRON HCL 4 MG/2ML IJ SOLN
4.0000 mg | Freq: Once | INTRAMUSCULAR | Status: AC
  Administered 2023-11-13: 4 mg via INTRAVENOUS
  Filled 2023-11-13: qty 2

## 2023-11-13 MED ORDER — ONDANSETRON HCL 4 MG PO TABS: 4.0000 mg | ORAL_TABLET | Freq: Four times a day (QID) | ORAL | 0 refills | Status: AC

## 2023-11-13 MED ORDER — MECLIZINE HCL 12.5 MG PO TABS
25.0000 mg | ORAL_TABLET | Freq: Once | ORAL | Status: AC
  Administered 2023-11-13: 25 mg via ORAL
  Filled 2023-11-13: qty 2

## 2023-11-13 MED ORDER — SODIUM CHLORIDE 0.9 % IV BOLUS
1000.0000 mL | Freq: Once | INTRAVENOUS | Status: AC
  Administered 2023-11-13: 1000 mL via INTRAVENOUS

## 2023-11-13 NOTE — ED Notes (Signed)
Pt lying in bed with family at bedside. Pt stated I am feeling a lot better now; I am a little wobbly still when I walk. Pt denies headache or dizziness at this time. Did state my ear feels full on the right side. EDP is aware.

## 2023-11-13 NOTE — ED Triage Notes (Signed)
Pt states this morning when she woke up she was dizzy and having to hold onto things to move around, pt states she is dizzy when she stands up and looks down, also complaining of nausea.

## 2023-11-13 NOTE — ED Provider Notes (Addendum)
 [name] AT [facility] Provider Note   CSN: [id number] Arrival date & time: 11/13/23  4098     History  Chief Complaint  Patient presents with   Dizziness    Alison House is a 78 y.o. female.  This is a 78 year old female presenting emergency department for dizziness.  She went to bed normal last night awoke this morning and had vertigo when she got out of bed.  Initially was a room spinning sensation, that then changed to lightheaded and feeling that she was going to pass out.  Seemingly positional with head movements.  Currently not having any vertigo.  She was nauseated before, no vomiting.  No chest pain, palpitations.  No headache, no vision changes, no unilateral weakness, no slurring of words.   Dizziness      Home Medications Prior to Admission medications   Medication Sig Start Date End Date Taking? Authorizing Provider  amoxicillin (AMOXIL) 500 MG capsule Take 1 capsule (500 mg total) by mouth 2 (two) times daily. 12/24/20   Tarri Glenn, Deanna M, PA-C  Glucosamine HCl-MSM (GLUCOSAMINE-MSM PO) Take 1 tablet by mouth daily.    [provider]  ibuprofen (ADVIL,MOTRIN) 200 MG tablet Take 200-600 mg by mouth daily as needed for headache or moderate pain.    [provider]  LORazepam (ATIVAN) 0.5 MG tablet Take 0.5 mg by mouth at bedtime as needed for anxiety or sleep.    [provider]  Magnesium 250 MG TABS Take 250 mg by mouth at bedtime.    [provider]  traZODone (DESYREL) 100 MG tablet Take 100 mg by mouth at bedtime. Taking 1/2 tab prn at bedtime    [provider]  triamcinolone cream (KENALOG) 0.1 % Apply 1 application topically 2 (two) times daily. 01/02/20   Wurst, [place], PA-C  vitamin C (ASCORBIC ACID) 500 MG tablet Take 500 mg by mouth 2 (two) times daily.     [provider]      Allergies    Elemental sulfur    Review of Systems   Review of Systems   Neurological:  Positive for dizziness.    Physical Exam Updated Vital Signs BP 134/77   Pulse 75   Temp 98.2 F (36.8 C) (Oral)   Resp (!) 21   Ht 5\' 5"  (1.651 m)   Wt 72.6 kg   SpO2 98%   BMI 26.63 kg/m  Physical Exam Vitals and nursing note reviewed.  Constitutional:      General: She is not in acute distress.    Appearance: She is not toxic-appearing.  HENT:     Nose: Nose normal.     Mouth/Throat:     Mouth: Mucous membranes are moist.  Eyes:     Conjunctiva/sclera: Conjunctivae normal.  Cardiovascular:     Rate and Rhythm: Normal rate and regular rhythm.  Pulmonary:     Effort: Pulmonary effort is normal.  Abdominal:     General: Abdomen is flat. There is no distension.     Tenderness: There is no abdominal tenderness. There is no guarding or rebound.  Musculoskeletal:        General: Normal range of motion.  Skin:    General: Skin is warm and dry.     Capillary Refill: Capillary refill takes less than 2 seconds.  Neurological:     General: No focal deficit present.     Mental Status: She is alert and oriented to person, place, and time.  Mental status is at baseline.     Cranial Nerves: No cranial nerve deficit.     Sensory: No sensory deficit.     Motor: No weakness.     Coordination: Coordination normal.  Psychiatric:        Mood and Affect: Mood normal.        Behavior: Behavior normal.     ED Results / Procedures / Treatments   Labs (all labs ordered are listed, but only abnormal results are displayed) Labs Reviewed  BASIC METABOLIC PANEL - Abnormal; Notable for the following components:      Result Value   Glucose, Bld 100 (*)    Calcium 8.7 (*)    All other components within normal limits  RESP PANEL BY RT-PCR (RSV, FLU A&B, COVID)  [id number]  CBC  TROPONIN I (HIGH SENSITIVITY)  TROPONIN I (HIGH SENSITIVITY)    EKG None  Radiology DG Chest Portable 1 View Result Date: 11/13/2023 CLINICAL DATA:  Chest pain. EXAM: PORTABLE CHEST 1 VIEW  COMPARISON:  02/13/2019. FINDINGS: Mildly increased interstitial markings are nonspecific but similar to the prior study. No frank pulmonary edema. Bilateral lung fields are clear. Bilateral costophrenic angles are clear. Note is made of elevated right hemidiaphragm. Normal cardio-mediastinal silhouette. No acute osseous abnormalities. The soft tissues are within normal limits. IMPRESSION: No active disease. Electronically Signed   By: Jules Schick M.D.   On: 11/13/2023 10:43   CT Head Wo Contrast Result Date: 11/13/2023 CLINICAL DATA:  TIA EXAM: CT HEAD WITHOUT CONTRAST TECHNIQUE: Contiguous axial images were obtained from the base of the skull through the vertex without intravenous contrast. RADIATION DOSE REDUCTION: This exam was performed according to the departmental dose-optimization program which includes automated exposure control, adjustment of the mA and/or kV according to patient size and/or use of iterative reconstruction technique. COMPARISON:  Brain MRI 04/23/2019 FINDINGS: Brain: No evidence of acute infarction, hemorrhage, hydrocephalus, extra-axial collection or mass lesion/mass effect. Vascular: No hyperdense vessel or unexpected calcification. Skull: Normal. Negative for fracture or focal lesion. Sinuses/Orbits: No acute finding. IMPRESSION: No acute or reversible finding. Electronically Signed   By: Tiburcio Pea M.D.   On: 11/13/2023 09:29    Procedures Procedures    Medications Ordered in ED Medications  meclizine (ANTIVERT) tablet 25 mg (25 mg Oral Given 11/13/23 0905)  ondansetron (ZOFRAN) injection 4 mg (4 mg Intravenous Given 11/13/23 0904)  sodium chloride 0.9 % bolus 1,000 mL (1,000 mLs Intravenous New Bag/Given 11/13/23 0902)    ED Course/ Medical Decision Making/ A&P Clinical Course as of 11/13/23 1208  Wed Nov 13, 2023  0936 CT Head Wo Contrast NO acute pathology on my independent interpretation.  [TY]  [id number] Basic metabolic panel(!) No significant metabolic  derangements.  Normal kidney function. [TY]  0936 CBC No leukocytosis to suggest systemic infection.  No anemia that would explain patient's symptoms [TY]  0936 Troponin I (High Sensitivity): 4 Initial troponin reassuring. [TY]  1206 Ambulated patient.  Symptoms much improved.  Ambulated with steady gait with no abnormalities.  Discharged in stable condition with suspected peripheral vertigo. [TY]    Clinical Course User Index [TY] Coral Spikes, DO                                 Medical Decision Making This is a 78 year old female presenting emergency department for vertigo.  She is afebrile nontachycardic hemodynamically stable.  Physical exam reassuring with no  neurodeficits.  CT head negative for acute pathology.  No significant metabolic derangements.  No markers of infection.  She did have some epigastric discomfort during the episode, has resolved.  EKG and troponin reassuring.  Low suspicion for ACS.  History most consistent with likely peripheral cause.  Treated with meclizine and IV fluids.  She is orthostatic negative.  Ambulatory in the emergency department and feels much improved.  Amount and/or Complexity of Data Reviewed Independent Historian:     Details: Friend notes patient has been complaining of right ear popping and clicking.  This suggests a peripheral cause of her symptoms. External Data Reviewed:     Details: She does take Ativan and trazodone on medication list.  Possible symptoms secondary to polypharmacy Labs: ordered. Decision-making details documented in ED Course. Radiology: ordered. Decision-making details documented in ED Course. ECG/medicine tests: ordered.  Risk Prescription drug management. Decision regarding hospitalization.   Concern for viral etiology; tested for flu/covid.        Final Clinical Impression(s) / ED Diagnoses Final diagnoses:  None    Rx / DC Orders ED Discharge Orders     None         Coral Spikes,  DO 11/13/23 1208    Coral Spikes, DO 11/27/23 1500

## 2023-11-13 NOTE — Discharge Instructions (Signed)
Please follow-up with your primary doctor.  Return immediately felt fevers, chills, sudden onset headache, vision changes, passout, chest pain, shortness of breath, facial droop, difficulty finding words, unilateral weakness, vertigo returns and is persistent, or you develop any new or worsening symptoms that are concerning to you.

## 2023-11-18 ENCOUNTER — Encounter (INDEPENDENT_AMBULATORY_CARE_PROVIDER_SITE_OTHER): Payer: Self-pay | Admitting: *Deleted

## 2023-11-26 ENCOUNTER — Telehealth (INDEPENDENT_AMBULATORY_CARE_PROVIDER_SITE_OTHER): Payer: Self-pay | Admitting: Gastroenterology

## 2023-11-26 NOTE — Telephone Encounter (Signed)
 Ok to schedule.  Room 1/2  Thanks,  Vista Lawman, MD Gastroenterology and Hepatology [facility]

## 2023-11-26 NOTE — Telephone Encounter (Signed)
 Left message to return call

## 2023-11-26 NOTE — Telephone Encounter (Signed)
 Who is your primary care physician: Dr.Dean (Dr.Badgers office)  Reasons for the colonoscopy:   Have you had a colonoscopy before?  Yes 5 years ago  Do you have family history of colon cancer? no  Previous colonoscopy with polyps removed? no  Do you have a history colorectal cancer?   no  Are you diabetic? If yes, Type 1 or Type 2?    no  Do you have a prosthetic or mechanical heart valve? no  Do you have a pacemaker/defibrillator?   no  Have you had endocarditis/atrial fibrillation? no  Have you had joint replacement within the last 12 months?  no  Do you tend to be constipated or have to use laxatives? no  Do you have any history of drugs or alchohol?  no  Do you use supplemental oxygen?  no  Have you had a stroke or heart attack within the last 6 months? no  Do you take weight loss medication?  no  For female patients: have you had a hysterectomy?  no                                     are you post menopausal?       no                                            do you still have your menstrual cycle? no      Do you take any blood-thinning medications such as: (aspirin, warfarin, Plavix, Aggrenox)  no  If yes we need the name, milligram, dosage and who is prescribing doctor  Current Outpatient Medications on File Prior to Visit  Medication Sig Dispense Refill   amoxicillin (AMOXIL) 500 MG capsule Take 1 capsule (500 mg total) by mouth 2 (two) times daily. (Patient not taking: Reported on 11/26/2023) 20 capsule 0   Glucosamine HCl-MSM (GLUCOSAMINE-MSM PO) Take 1 tablet by mouth daily. (Patient not taking: Reported on 11/26/2023)     ibuprofen (ADVIL,MOTRIN) 200 MG tablet Take 200-600 mg by mouth daily as needed for headache or moderate pain. (Patient not taking: Reported on 11/26/2023)     LORazepam (ATIVAN) 0.5 MG tablet Take 0.5 mg by mouth at bedtime as needed for anxiety or sleep.     Magnesium 250 MG TABS Take 250 mg by mouth at bedtime. (Patient not taking: Reported  on 11/26/2023)     meclizine (ANTIVERT) 25 MG tablet Take 1 tablet (25 mg total) by mouth 3 (three) times daily as needed for dizziness. (Patient not taking: Reported on 11/26/2023) 30 tablet 0   ondansetron (ZOFRAN) 4 MG tablet Take 1 tablet (4 mg total) by mouth every 6 (six) hours. (Patient not taking: Reported on 11/26/2023) 12 tablet 0   traZODone (DESYREL) 100 MG tablet Take 100 mg by mouth at bedtime. Taking 1/2 tab prn at bedtime     triamcinolone cream (KENALOG) 0.1 % Apply 1 application topically 2 (two) times daily. (Patient not taking: Reported on 11/26/2023) 80 g 0   vitamin C (ASCORBIC ACID) 500 MG tablet Take 500 mg by mouth 2 (two) times daily.  (Patient not taking: Reported on 11/26/2023)     No current facility-administered medications on file prior to visit.    Allergies  Allergen Reactions   Elemental Sulfur Nausea Only  Pharmacy: [facility]  Primary Insurance Name: [employer] Part D  Best number where you can be reached: [phone]

## 2023-12-02 NOTE — Telephone Encounter (Signed)
Left message to return call.  Will send letter.

## 2024-05-15 ENCOUNTER — Other Ambulatory Visit: Payer: Self-pay

## 2024-05-15 ENCOUNTER — Encounter: Payer: Self-pay | Admitting: Emergency Medicine

## 2024-05-15 ENCOUNTER — Ambulatory Visit
Admission: EM | Admit: 2024-05-15 | Discharge: 2024-05-15 | Disposition: A | Discharge status: Home / Self Care | Attending: Nurse Practitioner | Admitting: Nurse Practitioner

## 2024-05-15 DIAGNOSIS — J0110 Acute frontal sinusitis, unspecified: Secondary | ICD-10-CM

## 2024-05-15 DIAGNOSIS — R0989 Other specified symptoms and signs involving the circulatory and respiratory systems: Secondary | ICD-10-CM | POA: Diagnosis not present

## 2024-05-15 LAB — POC SOFIA SARS ANTIGEN FIA: SARS Coronavirus 2 Ag: NEGATIVE

## 2024-05-15 MED ORDER — FLUTICASONE PROPIONATE 50 MCG/ACT NA SUSP: 2.0000 | Freq: Every day | NASAL | 0 refills | Status: AC

## 2024-05-15 MED ORDER — AMOXICILLIN-POT CLAVULANATE 875-125 MG PO TABS: 1.0000 | ORAL_TABLET | Freq: Two times a day (BID) | ORAL | 0 refills | Status: AC

## 2024-05-15 NOTE — Discharge Instructions (Addendum)
 The COVID test was negative. Take medication as prescribed.  Recommend taking an allergy medication daily. Increase fluids and allow for plenty of rest. You may take over-the-counter Tylenol as needed for pain, fever, or general discomfort. Recommend use of normal saline nasal spray throughout the day for nasal congestion and runny nose. Recommend use of a humidifier in your bedroom at nighttime during sleep to help with nasal congestion. If symptoms fail to improve with this treatment, recommend follow-up with your primary care physician for further evaluation. Follow-up as needed.

## 2024-05-15 NOTE — ED Provider Notes (Signed)
 [facility]    CSN: [id number] Arrival date & time: 05/15/24  0801      History   Chief Complaint No chief complaint on file.   HPI Alison House is a 78 y.o. female.   The history is provided by the patient.   Patient presents with a more than 1 week history of headache, nasal congestion, postnasal drainage, and sinus pressure.  Patient endorses that she had a fever at the initial onset of her symptoms.  She denies ear pain, ear drainage, wheezing, difficulty breathing, cough, chest pain, abdominal pain, nausea, vomiting, diarrhea, or rash.  Reports she has been using over-the-counter allergy medications for her symptoms with minimal relief.  Patient does endorse prior history of seasonal allergies.  She denies any obvious close sick contacts.  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    Anxiety    Asthma    As a child   Cervical strain, initial encounter 04/13/2019   Cervicogenic headache 04/13/2019   Post concussion syndrome 04/13/2019    Patient Active Problem List   Diagnosis Date Noted   Episodic lightheadedness 01/27/2020   Post concussion syndrome 04/13/2019   Anosmia 04/13/2019   Cervicogenic headache 04/13/2019   Cervical strain, initial encounter 04/13/2019   History of colonic polyps 11/07/2016   Stiffness of joint, not elsewhere classified, lower leg 07/28/2013   Pain in knee 07/28/2013   Swelling of joint of left knee 07/28/2013    Past Surgical History:  Procedure Laterality Date   CHOLECYSTECTOMY     COLONOSCOPY N/A 12/13/2016   Procedure: COLONOSCOPY;  Surgeon: Claudis RAYMOND Rivet, MD;  Location: [facility];  Service: Endoscopy;  Laterality: N/A;  730   KNEE ARTHROSCOPY      OB History   No obstetric history on file.      Home Medications    Prior to Admission medications   Medication Sig Start Date End Date Taking? Authorizing Provider  amoxicillin-clavulanate (AUGMENTIN) 875-125 MG tablet Take  1 tablet by mouth every 12 (twelve) hours. 05/15/24  Yes Leath-Warren, Etta PARAS, NP  fluticasone (FLONASE) 50 MCG/ACT nasal spray Place 2 sprays into both nostrils daily. 05/15/24  Yes Leath-Warren, Etta PARAS, NP  amoxicillin (AMOXIL) 500 MG capsule Take 1 capsule (500 mg total) by mouth 2 (two) times daily. Patient not taking: Reported on 11/26/2023 12/24/20   Gideons Lalama, Deanna M, PA-C  Glucosamine HCl-MSM (GLUCOSAMINE-MSM PO) Take 1 tablet by mouth daily. Patient not taking: Reported on 11/26/2023    [provider]  ibuprofen (ADVIL,MOTRIN) 200 MG tablet Take 200-600 mg by mouth daily as needed for headache or moderate pain. Patient not taking: Reported on 11/26/2023    [provider]  LORazepam (ATIVAN) 0.5 MG tablet Take 0.5 mg by mouth at bedtime as needed for anxiety or sleep.    [provider]  Magnesium 250 MG TABS Take 250 mg by mouth at bedtime. Patient not taking: Reported on 11/26/2023    [provider]  meclizine (ANTIVERT) 25 MG tablet Take 1 tablet (25 mg total) by mouth 3 (three) times daily as needed for dizziness. Patient not taking: Reported on 11/26/2023 11/13/23   Neysa Caron PARAS, DO  ondansetron (ZOFRAN) 4 MG tablet Take 1 tablet (4 mg total) by mouth every 6 (six) hours. Patient not taking: Reported on 11/26/2023 11/13/23   Neysa Caron PARAS, DO  traZODone (DESYREL) 100 MG tablet Take 100 mg by mouth at bedtime  as needed. Taking 1/2 tab prn at bedtime    [provider]  triamcinolone cream (KENALOG) 0.1 % Apply 1 application topically 2 (two) times daily. Patient not taking: Reported on 11/26/2023 01/02/20   Wurst, [place], PA-C  vitamin C (ASCORBIC ACID) 500 MG tablet Take 500 mg by mouth 2 (two) times daily.  Patient not taking: Reported on 11/26/2023    [provider]    Family History Family History  Problem Relation Age of Onset   Dementia Mother    Diabetes Father    Heart Problems Father    Aneurysm Brother      Social History Social History   Tobacco Use   Smoking status: Never   Smokeless tobacco: Never  Vaping Use   Vaping status: Never Used  Substance Use Topics   Alcohol use: No   Drug use: No     Allergies   Elemental sulfur   Review of Systems Review of Systems Per HPI  Physical Exam Triage Vital Signs ED Triage Vitals  Encounter Vitals Group     BP 05/15/24 0822 (!) 149/81     Girls Systolic BP Percentile --      Girls Diastolic BP Percentile --      Boys Systolic BP Percentile --      Boys Diastolic BP Percentile --      Pulse Rate 05/15/24 0822 85     Resp 05/15/24 0822 20     Temp 05/15/24 0822 97.7 F (36.5 C)     Temp Source 05/15/24 0822 Oral     SpO2 05/15/24 0822 94 %     Weight --      Height --      Head Circumference --      Peak Flow --      Pain Score 05/15/24 0823 10     Pain Loc --      Pain Education --      Exclude from Growth Chart --    No data found.  Updated Vital Signs BP (!) 149/81 (BP Location: Right Arm)   Pulse 85   Temp 97.7 F (36.5 C) (Oral)   Resp 20   SpO2 94%   Visual Acuity Right Eye Distance:   Left Eye Distance:   Bilateral Distance:    Right Eye Near:   Left Eye Near:    Bilateral Near:     Physical Exam Vitals and nursing note reviewed.  Constitutional:      General: She is not in acute distress.    Appearance: Normal appearance.  HENT:     Head: Normocephalic.     Right Ear: Tympanic membrane, ear canal and external ear normal.     Left Ear: Tympanic membrane, ear canal and external ear normal.     Nose: Congestion present.     Right Turbinates: Enlarged and swollen.     Left Turbinates: Enlarged and swollen.     Right Sinus: Maxillary sinus tenderness and frontal sinus tenderness present.     Left Sinus: Maxillary sinus tenderness and frontal sinus tenderness present.     Mouth/Throat:     Lips: Pink.     Mouth: Mucous membranes are moist.     Pharynx: Postnasal drip present. No pharyngeal  swelling, oropharyngeal exudate, posterior oropharyngeal erythema or uvula swelling.     Comments: Cobblestoning present to posterior oropharynx  Eyes:     Extraocular Movements: Extraocular movements intact.     Pupils: Pupils are equal, round, and  reactive to light.  Cardiovascular:     Rate and Rhythm: Normal rate and regular rhythm.     Pulses: Normal pulses.     Heart sounds: Normal heart sounds.  Pulmonary:     Effort: Pulmonary effort is normal. No respiratory distress.     Breath sounds: Normal breath sounds. No stridor. No wheezing, rhonchi or rales.  Abdominal:     General: Bowel sounds are normal.     Palpations: Abdomen is soft.     Tenderness: There is no abdominal tenderness.  Musculoskeletal:     Cervical back: Normal range of motion.  Neurological:     General: No focal deficit present.     Mental Status: She is alert and oriented to person, place, and time.  Psychiatric:        Mood and Affect: Mood normal.        Behavior: Behavior normal.      UC Treatments / Results  Labs (all labs ordered are listed, but only abnormal results are displayed) Labs Reviewed  POC SOFIA SARS ANTIGEN FIA    EKG   Radiology No results found.  Procedures Procedures (including critical care time)  Medications Ordered in UC Medications - No data to display  Initial Impression / Assessment and Plan / UC Course  I have reviewed the triage vital signs and the nursing notes.  Pertinent labs & imaging results that were available during my care of the patient were reviewed by me and considered in my medical decision making (see chart for details).  COVID test was negative.  Patient with more than 1 week history of upper respiratory symptoms.  On exam, lung sounds are clear throughout, room air sats at 94%.  Patient does have moderate frontal sinus pressure and tenderness.  She has been taking over-the-counter allergy medications with no relief of her symptoms.  Will treat for  possible bacterial etiology with Augmentin 875/125 mg tablets and fluticasone 50 mcg nasal spray.  Supportive care recommendations were provided discussed with the patient to include fluids, rest, over-the-counter analgesics, normal saline nasal spray, and use of a humidifier.  Discussed indications with patient regarding follow-up.  Patient was in agreement with this plan of care and verbalizes understanding.  All questions were answered.  Patient stable for discharge.   Final Clinical Impressions(s) / UC Diagnoses   Final diagnoses:  Upper respiratory symptom  Acute frontal sinusitis, recurrence not specified     Discharge Instructions      The COVID test was negative. Take medication as prescribed.  Recommend taking an allergy medication daily. Increase fluids and allow for plenty of rest. You may take over-the-counter Tylenol as needed for pain, fever, or general discomfort. Recommend use of normal saline nasal spray throughout the day for nasal congestion and runny nose. Recommend use of a humidifier in your bedroom at nighttime during sleep to help with nasal congestion. If symptoms fail to improve with this treatment, recommend follow-up with your primary care physician for further evaluation. Follow-up as needed.     ED Prescriptions     Medication Sig Dispense Auth. Provider   amoxicillin-clavulanate (AUGMENTIN) 875-125 MG tablet Take 1 tablet by mouth every 12 (twelve) hours. 14 tablet Leath-Warren, Etta PARAS, NP   fluticasone (FLONASE) 50 MCG/ACT nasal spray Place 2 sprays into both nostrils daily. 16 g Leath-Warren, Etta PARAS, NP      PDMP not reviewed this encounter.   [place] Etta PARAS, NP 05/15/24 [phone]

## 2024-05-15 NOTE — ED Triage Notes (Addendum)
 Pt reports headache, bilateral ear pressure, dry mouth/throat itching, cough x6 days. Pt reports has been taking otc allergy medication with no change in symptoms. States I get something like this every year.

## 2024-05-29 ENCOUNTER — Other Ambulatory Visit (HOSPITAL_COMMUNITY): Payer: Self-pay | Admitting: Family Medicine

## 2024-05-29 DIAGNOSIS — Z1231 Encounter for screening mammogram for malignant neoplasm of breast: Secondary | ICD-10-CM

## 2024-07-03 ENCOUNTER — Ambulatory Visit (HOSPITAL_COMMUNITY)
Admission: RE | Admit: 2024-07-03 | Discharge: 2024-07-03 | Disposition: A | Discharge status: Home / Self Care | Source: Ambulatory Visit | Attending: Family Medicine | Admitting: Family Medicine

## 2024-07-03 ENCOUNTER — Encounter (HOSPITAL_COMMUNITY): Payer: Self-pay

## 2024-07-03 DIAGNOSIS — Z1231 Encounter for screening mammogram for malignant neoplasm of breast: Secondary | ICD-10-CM | POA: Insufficient documentation
